# Patient Record
Sex: Male | Born: 1970 | Race: White | Hispanic: No | Marital: Married | Smoking: Never smoker
Health system: Southern US, Community
[De-identification: ages and names within clinical notes are randomized; demographics above are authoritative.]

## PROBLEM LIST (undated history)

## (undated) DIAGNOSIS — E119 Type 2 diabetes mellitus without complications: Secondary | ICD-10-CM

---

## 2007-07-08 ENCOUNTER — Emergency Department (HOSPITAL_COMMUNITY): Admission: EM | Admit: 2007-07-08 | Discharge: 2007-07-08 | Payer: Self-pay | Admitting: Emergency Medicine

## 2007-07-13 ENCOUNTER — Emergency Department (HOSPITAL_COMMUNITY): Admission: EM | Admit: 2007-07-13 | Discharge: 2007-07-13 | Payer: Self-pay | Admitting: Emergency Medicine

## 2008-07-17 ENCOUNTER — Emergency Department (HOSPITAL_COMMUNITY): Admission: EM | Admit: 2008-07-17 | Discharge: 2008-07-17 | Payer: Self-pay | Admitting: Emergency Medicine

## 2011-01-22 LAB — INFLUENZA A AND B ANTIGEN (CONVERTED LAB): Influenza B Ag: NEGATIVE

## 2014-07-14 ENCOUNTER — Encounter (HOSPITAL_COMMUNITY): Payer: Self-pay | Admitting: Family Medicine

## 2014-07-14 ENCOUNTER — Emergency Department (HOSPITAL_COMMUNITY)
Admission: EM | Admit: 2014-07-14 | Discharge: 2014-07-14 | Disposition: A | Discharge status: Home / Self Care | Payer: 59 | Attending: Emergency Medicine | Admitting: Emergency Medicine

## 2014-07-14 DIAGNOSIS — W5512XA Struck by horse, initial encounter: Secondary | ICD-10-CM | POA: Diagnosis not present

## 2014-07-14 DIAGNOSIS — M79669 Pain in unspecified lower leg: Secondary | ICD-10-CM

## 2014-07-14 DIAGNOSIS — Y998 Other external cause status: Secondary | ICD-10-CM | POA: Insufficient documentation

## 2014-07-14 DIAGNOSIS — Y9289 Other specified places as the place of occurrence of the external cause: Secondary | ICD-10-CM | POA: Insufficient documentation

## 2014-07-14 DIAGNOSIS — Y9389 Activity, other specified: Secondary | ICD-10-CM | POA: Diagnosis not present

## 2014-07-14 DIAGNOSIS — S81801A Unspecified open wound, right lower leg, initial encounter: Secondary | ICD-10-CM | POA: Diagnosis not present

## 2014-07-14 DIAGNOSIS — T148XXA Other injury of unspecified body region, initial encounter: Secondary | ICD-10-CM

## 2014-07-14 DIAGNOSIS — S8991XA Unspecified injury of right lower leg, initial encounter: Secondary | ICD-10-CM | POA: Diagnosis present

## 2014-07-14 MED ORDER — HYDROCODONE-ACETAMINOPHEN 5-325 MG PO TABS: 1.0000 | ORAL_TABLET | ORAL | Status: DC | PRN

## 2014-07-14 MED ORDER — METHOCARBAMOL 500 MG PO TABS: 500.0000 mg | ORAL_TABLET | Freq: Three times a day (TID) | ORAL | Status: DC | PRN

## 2014-07-14 MED ORDER — IBUPROFEN 800 MG PO TABS: 800.0000 mg | ORAL_TABLET | Freq: Three times a day (TID) | ORAL | Status: DC | PRN

## 2014-07-14 NOTE — Discharge Instructions (Signed)
Read the information below.  Use the prescribed medication as directed.  Please discuss all new medications with your pharmacist.  Do not take additional tylenol while taking the prescribed pain medication to avoid overdose.  You may return to the Emergency Department at any time for worsening condition or any new symptoms that concern you.    If you develop uncontrolled pain, weakness or numbness of the extremity, severe discoloration of the skin return to the ER for a recheck.     Please keep weight off of your leg until you are seen by the orthopedist.    Muscle Strain A muscle strain is an injury that occurs when a muscle is stretched beyond its normal length. Usually a small number of muscle fibers are torn when this happens. Muscle strain is rated in degrees. First-degree strains have the least amount of muscle fiber tearing and pain. Second-degree and third-degree strains have increasingly more tearing and pain.  Usually, recovery from muscle strain takes 1-2 weeks. Complete healing takes 5-6 weeks.  CAUSES  Muscle strain happens when a sudden, violent force placed on a muscle stretches it too far. This may occur with lifting, sports, or a fall.  RISK FACTORS Muscle strain is especially common in athletes.  SIGNS AND SYMPTOMS At the site of the muscle strain, there may be:  Pain.  Bruising.  Swelling.  Difficulty using the muscle due to pain or lack of normal function. DIAGNOSIS  Your health care provider will perform a physical exam and ask about your medical history. TREATMENT  Often, the best treatment for a muscle strain is resting, icing, and applying cold compresses to the injured area.  HOME CARE INSTRUCTIONS   Use the PRICE method of treatment to promote muscle healing during the first 2-3 days after your injury. The PRICE method involves:  Protecting the muscle from being injured again.  Restricting your activity and resting the injured body part.  Icing your  injury. To do this, put ice in a plastic bag. Place a towel between your skin and the bag. Then, apply the ice and leave it on from 15-20 minutes each hour. After the third day, switch to moist heat packs.  Apply compression to the injured area with a splint or elastic bandage. Be careful not to wrap it too tightly. This may interfere with blood circulation or increase swelling.  Elevate the injured body part above the level of your heart as often as you can.  Only take over-the-counter or prescription medicines for pain, discomfort, or fever as directed by your health care provider.  Warming up prior to exercise helps to prevent future muscle strains. SEEK MEDICAL CARE IF:   You have increasing pain or swelling in the injured area.  You have numbness, tingling, or a significant loss of strength in the injured area. MAKE SURE YOU:   Understand these instructions.  Will watch your condition.  Will get help right away if you are not doing well or get worse. Document Released: 04/16/2005 Document Revised: 02/04/2013 Document Reviewed: 11/13/2012 [facility] Patient Information 2015 [place]. This information is not intended to replace advice given to you by your health care provider. Make sure you discuss any questions you have with your health care provider.  Musculoskeletal Pain Musculoskeletal pain is muscle and boney aches and pains. These pains can occur in any part of the body. Your caregiver may treat you without knowing the cause of the pain. They may treat you if blood or urine tests,  X-rays, and other tests were normal.  CAUSES There is often not a definite cause or reason for these pains. These pains may be caused by a type of germ (virus). The discomfort may also come from overuse. Overuse includes working out too hard when your body is not fit. Boney aches also come from weather changes. Bone is sensitive to atmospheric pressure changes. HOME CARE INSTRUCTIONS   Ask when  your test results will be ready. Make sure you get your test results.  Only take over-the-counter or prescription medicines for pain, discomfort, or fever as directed by your caregiver. If you were given medications for your condition, do not drive, operate machinery or power tools, or sign legal documents for 24 hours. Do not drink alcohol. Do not take sleeping pills or other medications that may interfere with treatment.  Continue all activities unless the activities cause more pain. When the pain lessens, slowly resume normal activities. Gradually increase the intensity and duration of the activities or exercise.  During periods of severe pain, bed rest may be helpful. Lay or sit in any position that is comfortable.  Putting ice on the injured area.  Put ice in a bag.  Place a towel between your skin and the bag.  Leave the ice on for 15 to 20 minutes, 3 to 4 times a day.  Follow up with your caregiver for continued problems and no reason can be found for the pain. If the pain becomes worse or does not go away, it may be necessary to repeat tests or do additional testing. Your caregiver may need to look further for a possible cause. SEEK IMMEDIATE MEDICAL CARE IF:  You have pain that is getting worse and is not relieved by medications.  You develop chest pain that is associated with shortness or breath, sweating, feeling sick to your stomach (nauseous), or throw up (vomit).  Your pain becomes localized to the abdomen.  You develop any new symptoms that seem different or that concern you. MAKE SURE YOU:   Understand these instructions.  Will watch your condition.  Will get help right away if you are not doing well or get worse. Document Released: 04/16/2005 Document Revised: 07/09/2011 Document Reviewed: 12/19/2012 [facility] Patient Information 2015 [place], [place]. This information is not intended to replace advice given to you by your health care provider. Make sure you discuss  any questions you have with your health care provider.

## 2014-07-14 NOTE — ED Provider Notes (Signed)
CSN: [id number]     Arrival date & time 07/14/14  0715 History   First MD Initiated Contact with Patient 07/14/14 [phone]     Chief Complaint  Patient presents with  . Leg Pain     (Consider location/radiation/quality/duration/timing/severity/associated sxs/prior Treatment) HPI   Pt presents with pain and swelling in his right calf.  States he was walking in the woods 4 days ago and stepped with his heel dipping low into a hole and developed pain in his right calf.  Yesterday he was training a horse, the horse jumped and he had to quickly move out of the way and he developed sudden worsening of the same area.  Feels like a constant cramp, worse with walking and palpation, moderate pain.  Has used aspercream and  Ibuprofen without relief.  Denies fevers, knee or ankle pain, weakness or numbness of the foot.  Denies other injuries or pain.   History reviewed. No pertinent past medical history. History reviewed. No pertinent past surgical history. History reviewed. No pertinent family history. History  Substance Use Topics  . Smoking status: Never Smoker   . Smokeless tobacco: Not on file  . Alcohol Use: No    Review of Systems  Constitutional: Negative for fever.  Cardiovascular: Positive for leg swelling.  Musculoskeletal: Positive for myalgias and gait problem. Negative for back pain, joint swelling and arthralgias.  Skin: Negative for color change, pallor, rash and wound.  Allergic/Immunologic: Negative for immunocompromised state.  Neurological: Negative for weakness and numbness.  Hematological: Does not bruise/bleed easily.  Psychiatric/Behavioral: Negative for self-injury (accidental).      Allergies  Review of patient's allergies indicates no known allergies.  Home Medications   Prior to Admission medications   Not on File   BP 126/77 mmHg  Pulse 78  Temp(Src) 98.2 F (36.8 C)  Resp 18  SpO2 100% Physical Exam  Constitutional: He appears well-developed and  well-nourished. No distress.  HENT:  Head: Normocephalic and atraumatic.  Neck: Neck supple.  Pulmonary/Chest: Effort normal.  Musculoskeletal:       Right knee: He exhibits normal range of motion and no swelling. No tenderness found.       Right ankle: He exhibits normal range of motion. No tenderness.       Right upper leg: He exhibits no tenderness and no swelling.       Right lower leg: He exhibits tenderness and swelling. He exhibits no bony tenderness, no deformity and no laceration.       Legs:      Right foot: Normal.  Tenderness and swelling of medal aspect of gastrocnemius.  No masses.   Thompson test is painful but achilles is intact.   Distal pulses and sensation intact.   Pt ambulates with limp.    Neurological: He is alert.  Skin: He is not diaphoretic.  Nursing note and vitals reviewed.   ED Course  Procedures (including critical care time) Labs Review Labs Reviewed - No data to display  Imaging Review No results found.   EKG Interpretation None       Discussed pt diagnosis and treatment with Dr Blinda Leatherwood.    MDM   Final diagnoses:  Calf pain, unspecified laterality  Muscle tear    Afebrile, nontoxic patient with injury to his right calf, first while walking in the woods and over-stretching the calf and second with sudden movement trying to avoid an untrained horse. No bony tenderness.  Neurovascularly intact.  No masses to suggest full tear  of the muscle.  Suspect partial tear of gastrocnemius caput mediale.    D/C home with pain medication, ace bandage, crutches, orthopedic follow up.  Discussed result, findings, treatment, and follow up  with patient.  Pt given return precautions.  Pt verbalizes understanding and agrees with plan.        Trixie Dredgemily Nevaeh Casillas, PA-C 07/14/14 0830  Gilda Creasehristopher J Pollina, MD 07/14/14 1003

## 2014-07-14 NOTE — ED Notes (Signed)
Pt having leg pain. sts that he pulled the back of his leg yesterday when tending to his horses. Pt rates pain 7/10.

## 2015-01-26 ENCOUNTER — Emergency Department (HOSPITAL_COMMUNITY): Payer: 59

## 2015-01-26 ENCOUNTER — Other Ambulatory Visit: Payer: Self-pay | Admitting: Nurse Practitioner

## 2015-01-26 ENCOUNTER — Ambulatory Visit (HOSPITAL_COMMUNITY)
Admission: RE | Admit: 2015-01-26 | Discharge: 2015-01-26 | Disposition: A | Discharge status: Home / Self Care | Payer: 59 | Source: Ambulatory Visit | Attending: Nurse Practitioner | Admitting: Nurse Practitioner

## 2015-01-26 ENCOUNTER — Other Ambulatory Visit (HOSPITAL_COMMUNITY): Payer: Self-pay | Admitting: Nurse Practitioner

## 2015-01-26 ENCOUNTER — Encounter (HOSPITAL_COMMUNITY): Payer: Self-pay

## 2015-01-26 ENCOUNTER — Emergency Department (HOSPITAL_COMMUNITY)
Admission: EM | Admit: 2015-01-26 | Discharge: 2015-01-26 | Disposition: A | Discharge status: Home / Self Care | Payer: 59 | Attending: Emergency Medicine | Admitting: Emergency Medicine

## 2015-01-26 DIAGNOSIS — R109 Unspecified abdominal pain: Secondary | ICD-10-CM

## 2015-01-26 DIAGNOSIS — R1011 Right upper quadrant pain: Secondary | ICD-10-CM

## 2015-01-26 LAB — COMPREHENSIVE METABOLIC PANEL
ALK PHOS: 52 U/L (ref 38–126)
ALT: 25 U/L (ref 17–63)
AST: 27 U/L (ref 15–41)
Albumin: 3.8 g/dL (ref 3.5–5.0)
Anion gap: 8 (ref 5–15)
BILIRUBIN TOTAL: 2 mg/dL — AB (ref 0.3–1.2)
BUN: 14 mg/dL (ref 6–20)
CALCIUM: 9.1 mg/dL (ref 8.9–10.3)
CHLORIDE: 97 mmol/L — AB (ref 101–111)
CO2: 28 mmol/L (ref 22–32)
CREATININE: 1.07 mg/dL (ref 0.61–1.24)
Glucose, Bld: 327 mg/dL — ABNORMAL HIGH (ref 65–99)
Potassium: 4.4 mmol/L (ref 3.5–5.1)
Sodium: 133 mmol/L — ABNORMAL LOW (ref 135–145)
Total Protein: 6.9 g/dL (ref 6.5–8.1)

## 2015-01-26 LAB — CBC WITH DIFFERENTIAL/PLATELET
BASOS ABS: 0.1 10*3/uL (ref 0.0–0.1)
Basophils Relative: 1 %
EOS PCT: 3 %
Eosinophils Absolute: 0.3 10*3/uL (ref 0.0–0.7)
HEMATOCRIT: 48.9 % (ref 39.0–52.0)
HEMOGLOBIN: 17.2 g/dL — AB (ref 13.0–17.0)
LYMPHS ABS: 2.5 10*3/uL (ref 0.7–4.0)
LYMPHS PCT: 31 %
MCH: 31.6 pg (ref 26.0–34.0)
MCHC: 35.2 g/dL (ref 30.0–36.0)
MCV: 89.7 fL (ref 78.0–100.0)
Monocytes Absolute: 0.8 10*3/uL (ref 0.1–1.0)
Monocytes Relative: 10 %
NEUTROS ABS: 4.6 10*3/uL (ref 1.7–7.7)
Neutrophils Relative %: 55 %
Platelets: 236 10*3/uL (ref 150–400)
RBC: 5.45 MIL/uL (ref 4.22–5.81)
RDW: 12.7 % (ref 11.5–15.5)
WBC: 8.2 10*3/uL (ref 4.0–10.5)

## 2015-01-26 LAB — LIPASE, BLOOD: LIPASE: 32 U/L (ref 22–51)

## 2015-01-26 MED ORDER — IOHEXOL 300 MG/ML  SOLN
100.0000 mL | Freq: Once | INTRAMUSCULAR | Status: AC | PRN
  Administered 2015-01-26: 100 mL via INTRAVENOUS

## 2015-01-26 MED ORDER — TRAMADOL HCL 50 MG PO TABS: 50.0000 mg | ORAL_TABLET | Freq: Two times a day (BID) | ORAL | Status: DC | PRN

## 2015-01-26 NOTE — ED Notes (Signed)
Per patient he started having stabbing pains in right upper abdominal area that started on this past Sunday; Pt states pain at 9/10 intermit; Pt a&ox 4 on arrival

## 2015-01-26 NOTE — Discharge Instructions (Signed)
Abdominal Pain Mr. Bochenek, your bloodwork and ultrasound today were normal. Take ibuprofen 800 mg every 8 hours as needed for pain. If your pain becomes severe take 1 tramadol. See a primary care physician within 3 days for close follow-up. If any symptoms worsen come back to the emergency department immediately. Thank you. Many things can cause belly (abdominal) pain. Most times, the belly pain is not dangerous. Many cases of belly pain can be watched and treated at home. HOME CARE   Do not take medicines that help you go poop (laxatives) unless told to by your doctor.  Only take medicine as told by your doctor.  Eat or drink as told by your doctor. Your doctor will tell you if you should be on a special diet. GET HELP IF:  You do not know what is causing your belly pain.  You have belly pain while you are sick to your stomach (nauseous) or have runny poop (diarrhea).  You have pain while you pee or poop.  Your belly pain wakes you up at night.  You have belly pain that gets worse or better when you eat.  You have belly pain that gets worse when you eat fatty foods.  You have a fever. GET HELP RIGHT AWAY IF:   The pain does not go away within 2 hours.  You keep throwing up (vomiting).  The pain changes and is only in the right or left part of the belly.  You have bloody or tarry looking poop. MAKE SURE YOU:   Understand these instructions.  Will watch your condition.  Will get help right away if you are not doing well or get worse. Document Released: 10/03/2007 Document Revised: 04/21/2013 Document Reviewed: 12/24/2012 [facility] Patient Information 2015 [place], [place]. This information is not intended to replace advice given to you by your health care provider. Make sure you discuss any questions you have with your health care provider.

## 2015-01-26 NOTE — ED Provider Notes (Signed)
CSN: [id number]     Arrival date & time 01/26/15  4098 History   By signing my name below, I, Arlan Organ, attest that this documentation has been prepared under the direction and in the presence of Tomasita Crumble, MD. Electronically Signed: Arlan Organ, ED Scribe. 01/26/2015. 3:55 AM.   Chief Complaint  Patient presents with  . Abdominal Pain   The history is provided by the patient. No language interpreter was used.    HPI Comments: Cameron Mccarthy is a 43 y.o. male without any pertinent past medical history who presents to the Emergency Department complaining of constant, ongoing R upper quadrant abdominal pain x 3 days. Pain is described as stabbing and currently rated 9/10. Pain is made worse with deep breathing but not with food. No alleviating factors at this time. However, pt states pain eases off as the day goes on. OTC medications attempted at home without any improvement. No recent fever, chills, nausea, vomiting, chest pain, or shortness of breath. Denies any history of same. No previous history of abdominal surgeries. No known allergies to medications.  No past medical history on file. No past surgical history on file. No family history on file. Social History  Substance Use Topics  . Smoking status: Never Smoker   . Smokeless tobacco: Not on file  . Alcohol Use: No    Review of Systems  Constitutional: Negative for fever and chills.  Respiratory: Negative for cough and shortness of breath.   Cardiovascular: Negative for chest pain.  Gastrointestinal: Positive for abdominal pain. Negative for nausea, vomiting and diarrhea.  Genitourinary: Negative for dysuria.  Musculoskeletal: Negative for back pain.  Skin: Negative for rash.  Neurological: Negative for numbness and headaches.  Psychiatric/Behavioral: Negative for confusion.  All other systems reviewed and are negative.     Allergies  Review of patient's allergies indicates no known allergies.  Home Medications    Prior to Admission medications   Medication Sig Start Date End Date Taking? Authorizing Provider  HYDROcodone-acetaminophen (NORCO/VICODIN) 5-325 MG per tablet Take 1 tablet by mouth every 4 (four) hours as needed for moderate pain or severe pain. 07/14/14   Trixie Dredge, PA-C  ibuprofen (ADVIL,MOTRIN) 800 MG tablet Take 1 tablet (800 mg total) by mouth every 8 (eight) hours as needed for mild pain or moderate pain. 07/14/14   Trixie Dredge, PA-C  methocarbamol (ROBAXIN) 500 MG tablet Take 1 tablet (500 mg total) by mouth every 8 (eight) hours as needed for muscle spasms (and pain). 07/14/14   Trixie Dredge, PA-C   Triage Vitals: BP 133/77 mmHg  Pulse 70  Temp(Src) 98.3 F (36.8 C) (Oral)  Resp 18  Ht  (1.778 m)  Wt 215 lb (97.523 kg)  BMI 30.85 kg/m2  SpO2 94%   Physical Exam  Constitutional: He is oriented to person, place, and time. Vital signs are normal. He appears well-developed and well-nourished.  Non-toxic appearance. He does not appear ill. No distress.  HENT:  Head: Normocephalic and atraumatic.  Nose: Nose normal.  Mouth/Throat: Oropharynx is clear and moist. No oropharyngeal exudate.  Eyes: Conjunctivae and EOM are normal. Pupils are equal, round, and reactive to light. No scleral icterus.  Neck: Normal range of motion. Neck supple. No tracheal deviation, no edema, no erythema and normal range of motion present. No thyroid mass and no thyromegaly present.  Cardiovascular: Normal rate, regular rhythm, S1 normal, S2 normal, normal heart sounds, intact distal pulses and normal pulses.  Exam reveals no gallop and no friction  rub.   No murmur heard. Pulses:      Radial pulses are 2+ on the right side, and 2+ on the left side.       Dorsalis pedis pulses are 2+ on the right side, and 2+ on the left side.  Pulmonary/Chest: Effort normal and breath sounds normal. No respiratory distress. He has no wheezes. He has no rhonchi. He has no rales.  Abdominal: Soft. Normal appearance and  bowel sounds are normal. He exhibits no distension, no ascites and no mass. There is no hepatosplenomegaly. There is tenderness. There is no rebound, no guarding and no CVA tenderness.  Tenderness to palpation over RUQ Positive Murphy's sign  Musculoskeletal: Normal range of motion. He exhibits no edema or tenderness.  Lymphadenopathy:    He has no cervical adenopathy.  Neurological: He is alert and oriented to person, place, and time. He has normal strength. No cranial nerve deficit or sensory deficit.  Skin: Skin is warm, dry and intact. No petechiae and no rash noted. He is not diaphoretic. No erythema. No pallor.  Psychiatric: He has a normal mood and affect. His behavior is normal. Judgment normal.  Nursing note and vitals reviewed.   ED Course  Procedures (including critical care time)  DIAGNOSTIC STUDIES: Oxygen Saturation is 94% on RA, adequate by my interpretation.    COORDINATION OF CARE: 3:40 AM- Will order US abdomen limited RUQ, CXR, CBC, CMP, and lipase. Discussed treatment plan with pt at bedside and pt agreed to plan.     Labs Review Labs Reviewed  CBC WITH DIFFERENTIAL/PLATELET - Abnormal; Notable for the following:    Hemoglobin 17.2 (*)    All other components within normal limits  COMPREHENSIVE METABOLIC PANEL - Abnormal; Notable for the following:    Sodium 133 (*)    Chloride 97 (*)    Glucose, Bld 327 (*)    Total Bilirubin 2.0 (*)    All other components within normal limits  LIPASE, BLOOD    Imaging Review Dg Chest 2 View  01/26/2015   CLINICAL DATA:  Right upper quadrant abdominal pain. Coughing. Evaluate for lower lobe pneumonia.  EXAM: CHEST  2 VIEW  COMPARISON:  None.  FINDINGS: Streaky left basilar density compatible with mild atelectasis. No right-sided pleural or pulmonary abnormality to explain pain.  Normal heart size and mediastinal contours.  IMPRESSION: 1. No right-sided pleural or parenchymal finding to explain right upper quadrant pain. 2.  Mild left basilar atelectasis.   Electronically Signed   By: Marnee Spring M.D.   On: 01/26/2015 05:19   US Abdomen Limited Ruq  01/26/2015   CLINICAL DATA:  Right upper quadrant pain  EXAM: US ABDOMEN LIMITED - RIGHT UPPER QUADRANT  COMPARISON:  None.  FINDINGS: Gallbladder:  No gallstones or wall thickening visualized. No sonographic Murphy sign noted. Dependent echoes are likely artifactual as they are not reproducibly seen on all images.  Common bile duct:  Diameter: 2 mm  Liver:  Echogenic liver with poor acoustic transmission and central sparing. No evidence of mass lesion. Antegrade flow in the imaged portal venous system.  IMPRESSION: No acute finding or cholelithiasis.  Hepatic steatosis.   Electronically Signed   By: Marnee Spring M.D.   On: 01/26/2015 04:50   I have personally reviewed and evaluated these images and lab results as part of my medical decision-making.   EKG Interpretation None      MDM   Final diagnoses:  None   Patient presents emergency department for  right upper quadrant abdominal pain for the past 3 days. His physical exam was concerning for gallbladder pathology. Patient also notes a cough recently, will obtain x-ray to evaluate for right lower lobe pneumonia. He is not requesting anything for pain. Laboratory studies only significant for bilirubin of 2.0. He was given IV fluids. Awaiting ultrasound.  Chest x-ray and right upper quadrant ultrasound are negative.  Patient appears well and in no acute distress, he has been observed overnight. We'll discharge home with tramadol for severe pain.  Follow-up is advised and a referral was given. His vital signs were within his normal limits and he is safe for discharge.   I personally performed the services described in this documentation, which was scribed in my presence. The recorded information has been reviewed and is accurate.   Tomasita Crumble, MD 01/26/15 [phone]

## 2015-01-26 NOTE — ED Notes (Signed)
Patient transported to Ultrasound 

## 2016-01-04 ENCOUNTER — Emergency Department (HOSPITAL_COMMUNITY)
Admission: EM | Admit: 2016-01-04 | Discharge: 2016-01-04 | Disposition: A | Discharge status: Home / Self Care | Payer: 59

## 2016-11-18 ENCOUNTER — Encounter (HOSPITAL_COMMUNITY): Payer: Self-pay | Admitting: *Deleted

## 2016-11-18 ENCOUNTER — Ambulatory Visit (HOSPITAL_COMMUNITY)
Admission: EM | Admit: 2016-11-18 | Discharge: 2016-11-18 | Disposition: A | Discharge status: Home / Self Care | Payer: 59 | Attending: Family Medicine | Admitting: Family Medicine

## 2016-11-18 DIAGNOSIS — S80861A Insect bite (nonvenomous), right lower leg, initial encounter: Secondary | ICD-10-CM

## 2016-11-18 DIAGNOSIS — W57XXXA Bitten or stung by nonvenomous insect and other nonvenomous arthropods, initial encounter: Secondary | ICD-10-CM

## 2016-11-18 DIAGNOSIS — R21 Rash and other nonspecific skin eruption: Secondary | ICD-10-CM | POA: Diagnosis not present

## 2016-11-18 DIAGNOSIS — L03115 Cellulitis of right lower limb: Secondary | ICD-10-CM

## 2016-11-18 MED ORDER — DOXYCYCLINE HYCLATE 100 MG PO CAPS: 100.0000 mg | ORAL_CAPSULE | Freq: Two times a day (BID) | ORAL | 0 refills | Status: DC

## 2016-11-18 MED ORDER — HYDROCODONE-ACETAMINOPHEN 5-325 MG PO TABS: 2.0000 | ORAL_TABLET | ORAL | 0 refills | Status: DC | PRN

## 2016-11-18 NOTE — ED Provider Notes (Signed)
CSN: [id number]     Arrival date & time 11/18/16  1402 History   None    Chief Complaint  Patient presents with  . Tick Removal   (Consider location/radiation/quality/duration/timing/severity/associated sxs/prior Treatment) Patient c/o tick bite right leg and right leg rash and right leg pain.   The history is provided by the patient.  Rash  Location:  Leg Leg rash location:  R leg Quality: redness   Severity:  Moderate Onset quality:  Sudden Duration:  2 days Timing:  Constant Chronicity:  New Relieved by:  Nothing Worsened by:  Nothing Ineffective treatments:  None tried   History reviewed. No pertinent past medical history. History reviewed. No pertinent surgical history. History reviewed. No pertinent family history. Social History  Substance Use Topics  . Smoking status: Light Tobacco Smoker    Types: Cigarettes  . Smokeless tobacco: Not on file  . Alcohol use Yes    Review of Systems  Constitutional: Negative.   HENT: Negative.   Eyes: Negative.   Respiratory: Negative.   Cardiovascular: Negative.   Gastrointestinal: Negative.   Endocrine: Negative.   Genitourinary: Negative.   Musculoskeletal: Negative.   Skin: Positive for rash.  Allergic/Immunologic: Negative.   Neurological: Negative.   Hematological: Negative.   Psychiatric/Behavioral: Negative.     Allergies  Patient has no known allergies.  Home Medications   Prior to Admission medications   Medication Sig Start Date End Date Taking? Authorizing Provider  doxycycline (VIBRAMYCIN) 100 MG capsule Take 1 capsule (100 mg total) by mouth 2 (two) times daily. 11/18/16   Deatra Canterxford, Ulla Mckiernan J, FNP  HYDROcodone-acetaminophen (NORCO/VICODIN) 5-325 MG tablet Take 2 tablets by mouth every 4 (four) hours as needed. 11/18/16   Deatra Canterxford, Zaynah Chawla J, FNP  traMADol (ULTRAM) 50 MG tablet Take 1 tablet (50 mg total) by mouth every 12 (twelve) hours as needed for severe pain. 01/26/15   Tomasita Crumbleni, Adeleke, MD   Meds Ordered  and Administered this Visit  Medications - No data to display  BP 137/87 (BP Location: Right Arm)   Pulse 66   Temp 98.8 F (37.1 C) (Oral)   Resp 18   SpO2 99%  No data found.   Physical Exam  Constitutional: He appears well-developed and well-nourished.  HENT:  Head: Normocephalic and atraumatic.  Eyes: Pupils are equal, round, and reactive to light. Conjunctivae and EOM are normal.  Neck: Normal range of motion. Neck supple.  Cardiovascular: Normal rate, regular rhythm and normal heart sounds.   Pulmonary/Chest: Effort normal and breath sounds normal.  Skin: Rash noted.  Bulls eye right leg approx 6 cm diameter  Nursing note and vitals reviewed.   Urgent Care Course     Procedures (including critical care time)  Labs Review Labs Reviewed - No data to display  Imaging Review No results found.   Visual Acuity Review  Right Eye Distance:   Left Eye Distance:   Bilateral Distance:    Right Eye Near:   Left Eye Near:    Bilateral Near:         MDM   1. Tick bite, initial encounter   2. Cellulitis of leg, right    Doxycycline 100mg  one po bid x 14 days #28 Norco 5/325 one po q 6 hours prn #6      Deatra CanterOxford, Javaris Wigington J, FNP 11/18/16 1619

## 2016-11-18 NOTE — ED Triage Notes (Signed)
Pt  Removed       Multiple      From  Various   Locations     sev  Days  Ago     Has    Some   Swelling   Pain  Itching  From  r     Lower  Leg     Feel    Fatigued   And  Tired       Pt  Has  A  Red  Circle  To  Area  On  r   Lower  Leg

## 2017-08-02 ENCOUNTER — Encounter (HOSPITAL_COMMUNITY): Payer: Self-pay | Admitting: Family Medicine

## 2017-08-02 ENCOUNTER — Ambulatory Visit (HOSPITAL_COMMUNITY)
Admission: EM | Admit: 2017-08-02 | Discharge: 2017-08-02 | Disposition: A | Discharge status: Home / Self Care | Payer: 59 | Attending: Family Medicine | Admitting: Family Medicine

## 2017-08-02 DIAGNOSIS — H6591 Unspecified nonsuppurative otitis media, right ear: Secondary | ICD-10-CM | POA: Diagnosis not present

## 2017-08-02 NOTE — ED Provider Notes (Signed)
[facility]    CSN: [id number] Arrival date & time: 08/02/17  1454     History   Chief Complaint Chief Complaint  Patient presents with  . Ear Fullness    HPI Cameron Mccarthy is a 47 y.o. male.   47 yo male here for right ear "clogged" x 3 days. Went to another urgent care and had fluid pushed into his ear and this hurt and did not help symptoms. He admits to trouble hearing out of this year. He had a sinus infection about 1 month ago.      History reviewed. No pertinent past medical history.  There are no active problems to display for this patient.   History reviewed. No pertinent surgical history.     Home Medications    Prior to Admission medications   Not on File    Family History History reviewed. No pertinent family history.  Social History Social History   Tobacco Use  . Smoking status: Light Tobacco Smoker    Types: Cigarettes  Substance Use Topics  . Alcohol use: Yes  . Drug use: No     Allergies   Patient has no known allergies.   Review of Systems Review of Systems  Constitutional: Negative for activity change and appetite change.  HENT: Positive for ear pain. Negative for ear discharge.   Eyes: Negative for discharge and itching.  Respiratory: Negative for apnea and choking.   Cardiovascular: Negative for chest pain and leg swelling.  Gastrointestinal: Negative for abdominal distention and abdominal pain.  Endocrine: Negative for cold intolerance and heat intolerance.  Genitourinary: Negative for difficulty urinating and dysuria.  Musculoskeletal: Negative for arthralgias and back pain.  Neurological: Negative for dizziness and headaches.  Hematological: Negative for adenopathy. Does not bruise/bleed easily.     Physical Exam Triage Vital Signs ED Triage Vitals  Enc Vitals Group     BP 08/02/17 1537 (!) 125/92     Pulse Rate 08/02/17 1537 80     Resp 08/02/17 1537 18     Temp --      Temp src --      SpO2  08/02/17 1537 100 %     Weight --      Height --      Head Circumference --      Peak Flow --      Pain Score 08/02/17 1535 0     Pain Loc --      Pain Edu? --      Excl. in GC? --    No data found.  Updated Vital Signs BP (!) 125/92   Pulse 80   Resp 18   SpO2 100%   Visual Acuity Right Eye Distance:   Left Eye Distance:   Bilateral Distance:    Right Eye Near:   Left Eye Near:    Bilateral Near:     Physical Exam  Constitutional: He is oriented to person, place, and time. He appears well-developed and well-nourished.  HENT:  Head: Normocephalic and atraumatic.  Right TM with clear fluid  Eyes: Pupils are equal, round, and reactive to light. EOM are normal.  Neck: Normal range of motion.  Cardiovascular: Normal rate and intact distal pulses.  Pulmonary/Chest: Effort normal. No respiratory distress.  Musculoskeletal: Normal range of motion. He exhibits no edema.  Neurological: He is alert and oriented to person, place, and time.  Skin: Skin is warm and dry.     UC Treatments / Results  Labs (all  labs ordered are listed, but only abnormal results are displayed) Labs Reviewed - No data to display  EKG None Radiology No results found.  Procedures Procedures (including critical care time)  Medications Ordered in UC Medications - No data to display   Initial Impression / Assessment and Plan / UC Course  I have reviewed the triage vital signs and the nursing notes.  Pertinent labs & imaging results that were available during my care of the patient were reviewed by me and considered in my medical decision making (see chart for details).     1. Serous otitis media, right- advised benign, self-limited nature of disease. Follow up with PCP prn.  Final Clinical Impressions(s) / UC Diagnoses   Final diagnoses:  None    ED Discharge Orders    None       Controlled Substance Prescriptions [place] Controlled Substance Registry consulted? Not Applicable     Rolm BookbinderMoss, Taison Celani, DO 08/02/17 [id number]

## 2017-08-02 NOTE — ED Triage Notes (Signed)
Pt here for trouble hearing out of right ear x 3 days. Denies pain or drainage.

## 2017-08-05 ENCOUNTER — Ambulatory Visit (INDEPENDENT_AMBULATORY_CARE_PROVIDER_SITE_OTHER): Payer: 59

## 2017-08-05 ENCOUNTER — Ambulatory Visit (INDEPENDENT_AMBULATORY_CARE_PROVIDER_SITE_OTHER): Payer: 59 | Admitting: Orthopaedic Surgery

## 2017-08-05 ENCOUNTER — Encounter (INDEPENDENT_AMBULATORY_CARE_PROVIDER_SITE_OTHER): Payer: Self-pay | Admitting: Orthopedic Surgery

## 2017-08-05 VITALS — Ht 70.0 in | Wt 215.0 lb

## 2017-08-05 DIAGNOSIS — G8929 Other chronic pain: Secondary | ICD-10-CM

## 2017-08-05 DIAGNOSIS — M25512 Pain in left shoulder: Secondary | ICD-10-CM | POA: Diagnosis not present

## 2017-08-05 MED ORDER — METHYLPREDNISOLONE ACETATE 40 MG/ML IJ SUSP
40.0000 mg | INTRAMUSCULAR | Status: AC | PRN
  Administered 2017-08-05: 40 mg via INTRAMUSCULAR

## 2017-08-05 MED ORDER — BUPIVACAINE HCL 0.5 % IJ SOLN
2.0000 mL | INTRAMUSCULAR | Status: AC | PRN
  Administered 2017-08-05: 2 mL

## 2017-08-05 MED ORDER — LIDOCAINE HCL 1 % IJ SOLN
2.0000 mL | INTRAMUSCULAR | Status: AC | PRN
  Administered 2017-08-05: 2 mL

## 2017-08-05 NOTE — Progress Notes (Signed)
Office Visit Note   Patient: Cameron Mccarthy           Date of Birth: 09-05-1970           MRN: [id number] Visit Date: 08/05/2017              Requested by: No referring provider defined for this encounter. PCP: Patient, No Pcp Per   Assessment & Plan: Visit Diagnoses:  1. Chronic left shoulder pain     Plan: Impression is left scapulothoracic bursitis versus trigger point.  Trigger point injection was performed today under sterile conditions patient tolerated well.  Home exercises were demonstrated.  Referral to physical therapy.  Questions encouraged and answered.  Follow-up as needed.  Follow-Up Instructions: Return if symptoms worsen or fail to improve.   Orders:  Orders Placed This Encounter  Procedures  . XR Shoulder Left   No orders of the defined types were placed in this encounter.     Procedures: Trigger Point Inj Date/Time: 08/05/2017 5:13 PM Performed by: Tarry KosXu, Tiffane Sheldon M, MD Authorized by: Tarry KosXu, Letisha Yera M, MD   Consent Given by:  Patient Indications:  Pain Total # of Trigger Points:  1 Location: shoulder   Needle Size:  22 G Approach:  Dorsal Medications #1:  2 mL lidocaine 1 %; 40 mg methylPREDNISolone acetate 40 MG/ML; 2 mL bupivacaine 0.5 % Patient tolerance:  Patient tolerated the procedure well with no immediate complications     Clinical Data: No additional findings.   Subjective: Chief Complaint  Patient presents with  . Left Shoulder - Pain    Patient is a healthy 47 year old gentleman who comes in for chronic left scapulothoracic pain on the medial border of the left scapula.  Denies any injuries.  He does endorse popping and crackling but this is more anterior.  Denies any injuries.  He shows horses for profession.   Review of Systems  Constitutional: Negative.   All other systems reviewed and are negative.    Objective: Vital Signs: Ht 5\' 10"  (1.778 m)   Wt 215 lb (97.5 kg)   BMI 30.85 kg/m   Physical Exam  Constitutional: He  is oriented to person, place, and time. He appears well-developed and well-nourished.  HENT:  Head: Normocephalic and atraumatic.  Eyes: Pupils are equal, round, and reactive to light.  Neck: Neck supple.  Pulmonary/Chest: Effort normal.  Abdominal: Soft.  Musculoskeletal: Normal range of motion.  Neurological: He is alert and oriented to person, place, and time.  Skin: Skin is warm.  Psychiatric: He has a normal mood and affect. His behavior is normal. Judgment and thought content normal.  Nursing note and vitals reviewed.   Ortho Exam Left shoulder exam shows popping in the [place] joint but there are no asymmetry of the joint.  There is no pain or redness or swelling with the [place] joint.  His pain is localized to the medial border of the left scapula and more in the superior half.  There is no crepitus that I can appreciate with scapular motion.  Shoulder function is normal. Specialty Comments:  No specialty comments available.  Imaging: Xr Shoulder Left  Result Date: 08/05/2017 No acute or structural abnormalities    PMFS History: There are no active problems to display for this patient.  No past medical history on file.  No family history on file.  No past surgical history on file. Social History   Occupational History  . Not on file  Tobacco Use  . Smoking status:  Light Tobacco Smoker    Types: Cigarettes  . Smokeless tobacco: Never Used  Substance and Sexual Activity  . Alcohol use: Yes  . Drug use: No  . Sexual activity: Not on file

## 2017-09-13 ENCOUNTER — Ambulatory Visit: Payer: 59 | Admitting: Endocrinology

## 2017-10-29 ENCOUNTER — Encounter (HOSPITAL_COMMUNITY): Payer: Self-pay

## 2017-10-29 ENCOUNTER — Other Ambulatory Visit: Payer: Self-pay

## 2017-10-29 ENCOUNTER — Emergency Department (HOSPITAL_COMMUNITY)
Admission: EM | Admit: 2017-10-29 | Discharge: 2017-10-29 | Disposition: A | Discharge status: Home / Self Care | Payer: 59 | Attending: Emergency Medicine | Admitting: Emergency Medicine

## 2017-10-29 ENCOUNTER — Emergency Department (HOSPITAL_COMMUNITY): Payer: 59

## 2017-10-29 DIAGNOSIS — W228XXA Striking against or struck by other objects, initial encounter: Secondary | ICD-10-CM | POA: Diagnosis not present

## 2017-10-29 DIAGNOSIS — F17210 Nicotine dependence, cigarettes, uncomplicated: Secondary | ICD-10-CM | POA: Insufficient documentation

## 2017-10-29 DIAGNOSIS — S61412A Laceration without foreign body of left hand, initial encounter: Secondary | ICD-10-CM

## 2017-10-29 DIAGNOSIS — Y9389 Activity, other specified: Secondary | ICD-10-CM | POA: Diagnosis not present

## 2017-10-29 DIAGNOSIS — Y929 Unspecified place or not applicable: Secondary | ICD-10-CM | POA: Diagnosis not present

## 2017-10-29 DIAGNOSIS — Y998 Other external cause status: Secondary | ICD-10-CM | POA: Diagnosis not present

## 2017-10-29 DIAGNOSIS — E119 Type 2 diabetes mellitus without complications: Secondary | ICD-10-CM | POA: Diagnosis not present

## 2017-10-29 DIAGNOSIS — S6992XA Unspecified injury of left wrist, hand and finger(s), initial encounter: Secondary | ICD-10-CM | POA: Diagnosis present

## 2017-10-29 HISTORY — DX: Type 2 diabetes mellitus without complications: E11.9

## 2017-10-29 MED ORDER — TETANUS-DIPHTH-ACELL PERTUSSIS 5-2.5-18.5 LF-MCG/0.5 IM SUSP
0.5000 mL | Freq: Once | INTRAMUSCULAR | Status: DC
  Filled 2017-10-29: qty 0.5

## 2017-10-29 MED ORDER — LIDOCAINE-EPINEPHRINE (PF) 2 %-1:200000 IJ SOLN
10.0000 mL | Freq: Once | INTRAMUSCULAR | Status: AC
  Administered 2017-10-29: 10 mL
  Filled 2017-10-29: qty 20

## 2017-10-29 MED ORDER — IBUPROFEN 800 MG PO TABS
800.0000 mg | ORAL_TABLET | Freq: Once | ORAL | Status: AC
  Administered 2017-10-29: 800 mg via ORAL
  Filled 2017-10-29: qty 1

## 2017-10-29 MED ORDER — LIDOCAINE-EPINEPHRINE-TETRACAINE (LET) SOLUTION: 3.0000 mL | Freq: Once | NASAL | Status: DC

## 2017-10-29 NOTE — ED Provider Notes (Signed)
[facility]-EMERGENCY DEPT Provider Note   CSN: [id number] Arrival date & time: 10/29/17  1729     History   Chief Complaint Chief Complaint  Patient presents with  . Laceration    HPI Cameron Mccarthy is a 47 y.o. male presenting for evaluation of hand laceration.  Patient states he was changing a tire when he slipped and cut the lateral aspect of his left hand on the fender.  This happened just prior to arrival.  He denies injury elsewhere.  He has had mild bleeding since.  He denies numbness or tingling.  He reports pain of the hand and wrist, although it no injury there.  He has no other medical problems, does not take medications daily.  He is not on blood thinners.  He is not immunocompromised.  He has not taken anything for pain including Tylenol or ibuprofen.  Palpation makes the pain worse, nothing makes it better.  HPI  Past Medical History:  Diagnosis Date  . Diabetes mellitus without complication (HCC)     There are no active problems to display for this patient.   History reviewed. No pertinent surgical history.      Home Medications    Prior to Admission medications   Not on File    Family History History reviewed. No pertinent family history.  Social History Social History   Tobacco Use  . Smoking status: Light Tobacco Smoker    Types: Cigarettes  . Smokeless tobacco: Never Used  Substance Use Topics  . Alcohol use: Yes  . Drug use: No     Allergies   Patient has no known allergies.   Review of Systems Review of Systems  Skin: Positive for wound.  Neurological: Negative for numbness.  Hematological: Does not bruise/bleed easily.     Physical Exam Updated Vital Signs BP 126/87 (BP Location: Left Arm)   Pulse 74   Temp 98.3 F (36.8 C) (Oral)   Resp 16   Ht 5\' 11"  (1.803 m)   Wt 97.5 kg (215 lb)   SpO2 98%   BMI 29.99 kg/m   Physical Exam  Constitutional: He is oriented to person, place, and time. He  appears well-developed and well-nourished. No distress.  HENT:  Head: Normocephalic and atraumatic.  Eyes: EOM are normal.  Neck: Normal range of motion.  Pulmonary/Chest: Effort normal.  Abdominal: He exhibits no distension.  Musculoskeletal: Normal range of motion. He exhibits tenderness.  Laceration of the lateral aspect of the left dorsal hand, approximately 2 cm long.  Full active range of motion of the elbow, wrist, and fingers without apparent difficulty.  Strength of fingers against resistance intact.  Good cap refill.  Good radial pulses.  Neurological: He is alert and oriented to person, place, and time. No sensory deficit.  Skin: Skin is warm. Capillary refill takes less than 2 seconds. No rash noted.  Psychiatric: He has a normal mood and affect.  Nursing note and vitals reviewed.    ED Treatments / Results  Labs (all labs ordered are listed, but only abnormal results are displayed) Labs Reviewed - No data to display  EKG None  Radiology Dg Wrist Complete Left  Result Date: 10/29/2017 CLINICAL DATA:  Acute LEFT wrist injury and laceration. Initial encounter. EXAM: LEFT WRIST - COMPLETE 3+ VIEW COMPARISON:  None. FINDINGS: Soft tissue injury/laceration along the MEDIAL wrist noted. No acute fracture, subluxation or dislocation identified. No radiopaque bodies noted. IMPRESSION: Soft tissue injury/laceration without acute bony abnormality or radiopaque  foreign body. Electronically Signed   By: Harmon Pier M.D.   On: 10/29/2017 18:58    Procedures .Marland KitchenLaceration Repair Date/Time: 10/29/2017 9:00 PM Performed by: Alveria Apley, PA-C Authorized by: Alveria Apley, PA-C   Consent:    Consent obtained:  Verbal   Consent given by:  Patient   Risks discussed:  Infection, pain, poor cosmetic result and poor wound healing Anesthesia (see MAR for exact dosages):    Anesthesia method:  Local infiltration   Local anesthetic:  Lidocaine 2% WITH epi Laceration details:     Location:  Hand   Hand location:  L hand, dorsum   Length (cm):  2   Depth (mm):  5 Repair type:    Repair type:  Simple Pre-procedure details:    Preparation:  Patient was prepped and draped in usual sterile fashion and imaging obtained to evaluate for foreign bodies Exploration:    Wound extent: no foreign bodies/material noted, no nerve damage noted, no tendon damage noted and no underlying fracture noted   Treatment:    Area cleansed with:  Shur-Clens   Amount of cleaning:  Extensive Skin repair:    Repair method:  Sutures   Suture size:  5-0   Suture material:  Prolene   Suture technique:  Simple interrupted   Number of sutures:  5 Approximation:    Approximation:  Close Post-procedure details:    Dressing:  Open (no dressing)   Patient tolerance of procedure:  Tolerated well, no immediate complications   (including critical care time)  Medications Ordered in ED Medications  Tdap (BOOSTRIX) injection 0.5 mL (0.5 mLs Intramuscular Refused 10/29/17 2009)  ibuprofen (ADVIL,MOTRIN) tablet 800 mg (800 mg Oral Given 10/29/17 1906)  lidocaine-EPINEPHrine (XYLOCAINE W/EPI) 2 %-1:200000 (PF) injection 10 mL (10 mLs Infiltration Given 10/29/17 1906)     Initial Impression / Assessment and Plan / ED Course  I have reviewed the triage vital signs and the nursing notes.  Pertinent labs & imaging results that were available during my care of the patient were reviewed by me and considered in my medical decision making (see chart for details).     Patient presented for evaluation of hand lack.  Physical exam reassuring, neurovascularly intact.  Will obtain x-ray of the wrist as patient is having pain at the MCP and lateral wrist.  X-ray viewed interpreted by me, no fractures or dislocation.  Discussed with patient.  Wound numbed, irrigated, and sutured.  Aftercare instructions given.  Discussed follow-up with hand as needed.  Patient does not know when his last tetanus was, does not want  it today.  At this time, patient appears safe for discharge.  Return precautions given.  Patient states he understands and agrees to plan.   Final Clinical Impressions(s) / ED Diagnoses   Final diagnoses:  Laceration of left hand without foreign body, initial encounter    ED Discharge Orders    None       Alveria Apley, PA-C 10/29/17 2101    Benjiman Core, MD 10/30/17 [phone]

## 2017-10-29 NOTE — ED Triage Notes (Signed)
Pt reports that he was changing a tire today  and wrench slipped and cut his hand on fender and sustained a LAC to side of left hand on top of hand. Pt reports tingling sensation and pain throbbing 8/10.  Escorted with spouse. Bleeding controlled.

## 2017-10-29 NOTE — Discharge Instructions (Addendum)
1. Medications: Tylenol or ibuprofen for pain 2. Treatment: ice for swelling, keep wound clean with warm soap and water and keep bandage dry 3. Follow Up: Please return in 10 days to have your stitches/staples removed or sooner if you have concerns. Return to the emergency department for increased redness, drainage of pus from the wound   WOUND CARE  Keep area clean and dry. Wash wound gently with mild soap and warm water daily. Reapply a new bandage after cleaning wound.   Continue daily cleansing with soap and water until stitches are removed.  Do not apply any ointments or creams to the wound while stitches are in place, as this may cause delayed healing. Return if you experience any of the following signs of infection: Swelling, redness, pus drainage, streaking, fever >101.0 F  Return if you experience excessive bleeding that does not stop after 15-20 minutes of constant, firm pressure.

## 2017-11-10 NOTE — Progress Notes (Signed)
Patient ID: Cameron Mccarthy, male   DOB: 1970-05-27, 47 y.o.   MRN: [id number]          Reason for Appointment: Consultation for Type 2 Diabetes  Referring physician: Melony Overly   History of Present Illness:          Date of diagnosis of type 2 diabetes mellitus:    2016     Background history:    His diabetes was diagnosed incidentally when he was in the emergency room for abdominal pain He had a blood sugar of over 300 at that time and symptoms of feeling sluggish, increased urination He was tried on metformin but because of diarrhea and generally not feeling well he did not continue this for more than 3 months Also he thinks it was not helping his sugar Subsequently he did not follow-up and did not monitor his diabetes at all  Recent history:       Non-insulin hypoglycemic drugs the patient is taking are: None  Current history, management, blood sugar patterns and problems identified:  He was diagnosed to have diabetes incidentally when he was being treated by his ENT surgeon for acute onset of hearing loss and was having symptoms of excessive nocturia in the office was over 400  Although he was due to be seen in consultation in 08/2017 he is now coming for evaluation  He has no glucose meter at home, previously did have one  He does not think he recently has had any increased thirst, urination but is feeling more fatigued in general   His glucose nonfasting in the office today was 309  He has not had any recent weight change  Appears to have poor understanding of diabetes and how this is caused as well as not having any meal plan or paying attention to low-fat diet  He does however try to avoid drinks with sugar especially recently        Side effects from medications have been: Diarrhea and malaise from metformin  Compliance with the medical regimen: Poor:    Glucose monitoring: Not  done  Self-care: The diet that the patient has been following is: tries to  limit drinks with sugar.    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 Always use your most recent med list.          empagliflozin 10 MG Tabs tablet Commonly known as:  JARDIANCE Take 10 mg by mouth daily with breakfast.   gabapentin 100 MG capsule Commonly  known as:  NEURONTIN Take 2 capsules (200 mg total) by mouth at bedtime.   glimepiride 4 MG tablet Commonly known as:  AMARYL Take 1 tablet (4 mg total) by mouth daily before supper.   glucose blood test strip Commonly known as:  ONE TOUCH ULTRA TEST Use as instructed to check blood sugar 2 times daily.   ONE TOUCH ULTRA 2 w/Device Kit 1 each by Does not apply route daily. Use meter to check blood sugar 2 times daily.   onetouch ultrasoft lancets Use as instructed to check blood sugar 2 times daily.       Allergies: No Known Allergies  Past Medical History:  Diagnosis Date  . Diabetes mellitus without complication ([place])     History reviewed. No pertinent surgical history.  Family History  Problem Relation Age of Onset  . Diabetes Neg Hx     Social History:  reports that he has been smoking cigarettes.  He has never used smokeless tobacco. He reports that he drinks alcohol. He  reports that he does not use drugs.   Review of Systems  Constitutional: Negative for weight loss.  HENT:       He has mild hearing loss which was treated with partial improvement with prednisone for 12 days by ENT surgeon in 5/19  Eyes:       Has had mild blurred vision especially in the last couple of months  Respiratory: Negative for shortness of breath.        Has some sleepiness after lunch but not generally  Cardiovascular: Negative for leg swelling.  Gastrointestinal: Negative for abdominal pain.  Endocrine: Positive for abnormal weight gain.  Genitourinary:       Only occasional nocturia  Skin: Negative for rash.  Neurological: Positive for tingling. Negative for weakness and numbness.       He has some tingling, coldness and pains in his feet and lower legs, sometimes difficult to sleep with this  Psychiatric/Behavioral: Positive for insomnia.       Due to leg pain     Lipid history: Previously has had high cholesterol, LDL 133 done in 2016, never on treatment   No results found for: CHOL, HDL, LDLCALC, LDLDIRECT, TRIG, CHOLHDL         Blood pressure readings:  BP Readings from Last 3 Encounters:  11/11/17 120/80  10/29/17 126/87  08/02/17 (!) 125/92    Most recent eye exam was few years ago  Most recent foot exam: 10/2017    LABS:  Office Visit on 11/11/2017  Component Date Value Ref Range Status  . POC Glucose 11/11/2017 309* 70 - 99 mg/dl Final  . Hemoglobin A1C 11/11/2017 10.7* 4.0 - 5.6 % Final    Physical Examination:  BP 120/80 (BP Location: Left Arm, Patient Position: Sitting, Cuff Size: Normal)   Pulse 83   Ht '5\' 11"'$  (1.803 m)   Wt 217 lb (98.4 kg)   SpO2 97%   BMI 30.27 kg/m   GENERAL:         Patient has mild abdominal obesity.    HEENT:         Eye exam shows normal external appearance.  Fundus exam shows no retinopathy.  Oral exam shows normal mucosa .  NECK:   There is no lymphadenopathy  Thyroid is not enlarged and no nodules  felt.  Carotids are normal to palpation and no bruit heard  LUNGS:         Chest is symmetrical. Lungs are  clear to auscultation.Marland Kitchen   HEART:         Heart sounds:  S1 and S2 are normal. No murmur or click heard., no S3 or S4.   ABDOMEN:   There is no distention present. Liver and spleen are not palpable. No other mass or tenderness present.    NEUROLOGICAL:   Ankle jerks are absent bilaterally.    Diabetic Foot Exam - Simple   Simple Foot Form Diabetic Foot exam was performed with the following findings:  Yes   Visual Inspection No deformities, no ulcerations, no other skin breakdown bilaterally:  Yes Sensation Testing See comments:  Yes Pulse Check Posterior Tibialis and Dorsalis pulse intact bilaterally:  Yes Comments Decreased monofilament sensation on all the toes and distal plantar surfaces            Vibration sense is mildly reduced in distal first toes. MUSCULOSKELETAL:  There is no swelling or deformity of the peripheral joints.     EXTREMITIES:     There is no edema.  SKIN:       No rash or lesions of concern.        ASSESSMENT:  Diabetes type 2, uncontrolled since at least 2016  A1c is now 10.7  He is currently untreated and even with blood sugars over 300% has only minimal symptoms of fatigue and mild blurred vision Discussed in detail the etiology of diabetes and natural history Discussed that since he has had diabetes for at least 3 to 4 years he may be partially insulin deficient and likely will need to drug treatment to start with Also does need to start improving his diet with cutting back on high fat foods and portions Needs significant amount of diabetes education also  Complications of diabetes: Peripheral neuropathy, painful.  Unknown status of nephropathy and retinopathy Discussed with him the nature of neuropathy and need for better diabetes control  History of hyperlipidemia   PLAN:    Start home glucose monitoring  Since his insurance may be  covering the One Touch ultra 2 system he will start using this  Discussed that sugar targets both fasting and after meals which he needs to check at least once a day on an average  Encouraged him to cut back on high fat foods  Consultation with dietitian will be arranged  Difficult to currently know what medications his insurance covers but will start him on AMARYL generic 4 mg daily at suppertime to help with short-term control, improve glucose toxicity  Also long-term may benefit from [place], he will start with 10 mg daily Discussed action of SGLT 2 drugs on lowering glucose by decreasing kidney absorption of glucose, benefits of weight loss and lower blood pressure, possible side effects including candidiasis and dosage regimen  If he has inadequate control may consider adding a GLP-1 drug  He will hold off on getting his eye exam until blood sugars are improved and stable  To check urine microalbumin when blood sugars are better control as also lipid profile  Follow-up in 3 weeks  Trial of GABAPENTIN 100-200 mg at bedtime for neuropathic symptoms    Patient Instructions  Check blood sugars on waking up  2-3/7 days   Also check blood sugars about 2 hours after a meal and do this after different meals by rotation  Recommended blood sugar levels on waking up is 90-130 and about 2 hours after meal is 130-160  Please bring your blood sugar monitor to each visit, thank you  Counseling time on subjects discussed in assessment and plan sections is over 50% of today's 60 minute visit   Consultation note has been sent to the referring physician  Elayne Snare 11/11/2017, 3:18 PM   Note: This office note was prepared with Dragon voice recognition system technology. Any transcriptional errors that result from this process are unintentional.

## 2017-11-11 ENCOUNTER — Ambulatory Visit: Payer: 59 | Admitting: Endocrinology

## 2017-11-11 ENCOUNTER — Other Ambulatory Visit: Payer: Self-pay

## 2017-11-11 ENCOUNTER — Encounter: Payer: Self-pay | Admitting: Endocrinology

## 2017-11-11 VITALS — BP 120/80 | HR 83 | Ht 71.0 in | Wt 217.0 lb

## 2017-11-11 DIAGNOSIS — E7800 Pure hypercholesterolemia, unspecified: Secondary | ICD-10-CM

## 2017-11-11 DIAGNOSIS — E1142 Type 2 diabetes mellitus with diabetic polyneuropathy: Secondary | ICD-10-CM

## 2017-11-11 DIAGNOSIS — E1165 Type 2 diabetes mellitus with hyperglycemia: Secondary | ICD-10-CM

## 2017-11-11 LAB — POCT GLYCOSYLATED HEMOGLOBIN (HGB A1C): Hemoglobin A1C: 10.7 % — AB (ref 4.0–5.6)

## 2017-11-11 LAB — COMPREHENSIVE METABOLIC PANEL
ALT: 25 U/L (ref 0–53)
AST: 15 U/L (ref 0–37)
Albumin: 4 g/dL (ref 3.5–5.2)
Alkaline Phosphatase: 50 U/L (ref 39–117)
BUN: 17 mg/dL (ref 6–23)
CO2: 27 meq/L (ref 19–32)
CREATININE: 1.38 mg/dL (ref 0.40–1.50)
Calcium: 9 mg/dL (ref 8.4–10.5)
Chloride: 99 mEq/L (ref 96–112)
GFR: 58.59 mL/min — AB (ref >60.00)
GLUCOSE: 313 mg/dL — AB (ref 70–99)
POTASSIUM: 3.8 meq/L (ref 3.5–5.1)
SODIUM: 134 meq/L — AB (ref 135–145)
Total Bilirubin: 1.3 mg/dL — ABNORMAL HIGH (ref 0.2–1.2)
Total Protein: 6.7 g/dL (ref 6.0–8.3)

## 2017-11-11 LAB — GLUCOSE, POCT (MANUAL RESULT ENTRY): POC Glucose: 309 mg/dl — AB (ref 70–99)

## 2017-11-11 MED ORDER — GABAPENTIN 100 MG PO CAPS: 200.0000 mg | ORAL_CAPSULE | Freq: Every day | ORAL | 3 refills | Status: DC

## 2017-11-11 MED ORDER — ONETOUCH ULTRA 2 W/DEVICE KIT: 1.0000 | PACK | Freq: Every day | 0 refills | Status: DC

## 2017-11-11 MED ORDER — EMPAGLIFLOZIN 10 MG PO TABS: 10.0000 mg | ORAL_TABLET | Freq: Every day | ORAL | 3 refills | Status: DC

## 2017-11-11 MED ORDER — GLUCOSE BLOOD VI STRP: ORAL_STRIP | 3 refills | Status: DC

## 2017-11-11 MED ORDER — ONETOUCH ULTRASOFT LANCETS MISC: 3 refills | Status: DC

## 2017-11-11 MED ORDER — GLIMEPIRIDE 4 MG PO TABS: 4.0000 mg | ORAL_TABLET | Freq: Every day | ORAL | 3 refills | Status: DC

## 2017-11-11 NOTE — Patient Instructions (Signed)
Check blood sugars on waking up 2-3/7 days   Also check blood sugars about 2 hours after a meal and do this after different meals by rotation  Recommended blood sugar levels on waking up is 90-130 and about 2 hours after meal is 130-160  Please bring your blood sugar monitor to each visit, thank you   

## 2017-11-13 ENCOUNTER — Ambulatory Visit: Payer: 59 | Admitting: Endocrinology

## 2017-12-02 ENCOUNTER — Encounter: Payer: Self-pay | Admitting: Endocrinology

## 2017-12-02 ENCOUNTER — Ambulatory Visit: Payer: 59 | Admitting: Endocrinology

## 2017-12-02 VITALS — BP 128/84 | HR 87 | Ht 71.0 in | Wt 217.8 lb

## 2017-12-02 DIAGNOSIS — E1165 Type 2 diabetes mellitus with hyperglycemia: Secondary | ICD-10-CM | POA: Diagnosis not present

## 2017-12-02 MED ORDER — GABAPENTIN 300 MG PO CAPS: 300.0000 mg | ORAL_CAPSULE | Freq: Three times a day (TID) | ORAL | 3 refills | Status: DC

## 2017-12-02 MED ORDER — EMPAGLIFLOZIN 25 MG PO TABS: 25.0000 mg | ORAL_TABLET | Freq: Every day | ORAL | 2 refills | Status: DC

## 2017-12-02 NOTE — Patient Instructions (Addendum)
Check blood sugars on waking up  3/7  Also check blood sugars about 2 hours after a meal and do this after different meals by rotation  Recommended blood sugar levels on waking up is 90-130 and about 2 hours after meal is 130-160  Please bring your blood sugar monitor to each visit, thank you  Take 2 Jardiance in am of 10mg 

## 2017-12-02 NOTE — Progress Notes (Signed)
Patient ID: Cameron Mccarthy, male   DOB: May 24, 1970, 47 y.o.   MRN: [id number]          Reason for Appointment: Follow-up for Type 2 Diabetes  Referring physician: Melony Overly   History of Present Illness:          Date of diagnosis of type 2 diabetes mellitus:   2016     Background history:    His diabetes was diagnosed incidentally when he was in the emergency room for abdominal pain He had a blood sugar of over 300 at that time and symptoms of feeling sluggish, increased urination He was tried on metformin but because of diarrhea and generally not feeling well he did not continue this for more than 3 months Also he thinks it was not helping his sugar Subsequently he did not follow-up and did not monitor his diabetes at all  Recent history:      His baseline A1c was 10.7 done in 10/2017  Non-insulin hypoglycemic drugs the patient is taking are: Amaryl 4 mg at dinnertime, Jardiance 10 mg in the morning  Current history, management, blood sugar patterns and problems identified:  He was found to have marked hyperglycemia when he was being treated by his ENT surgeon with steroids in 5/19  Blood sugar in the office prior to treatment was over 300  With starting Jardiance and Amaryl his blood sugars have progressively come down still not quite normal consistently  He has done readings mostly in the morning and some before lunch and dinner  Fasting readings are still relatively high  He was supposed to see the dietitian but still has not made an appointment  Weight is the same  No side effects with Jardiance        Side effects from medications have been: Diarrhea and malaise from metformin   Glucose monitoring: Not  done  Self-care: The diet that the patient has been following is: tries to limit drinks with sugar.    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 Always use your most recent med list.          empagliflozin 10 MG Tabs tablet Commonly known as:  JARDIANCE Take 10 mg by mouth daily with breakfast.   gabapentin 100 MG capsule Commonly known as:  NEURONTIN Take 2 capsules (200 mg total) by mouth at bedtime.   glimepiride 4 MG tablet Commonly known as:  AMARYL Take 1 tablet (4 mg total) by mouth daily before  supper.   glucose blood test strip Commonly known as:  ONE TOUCH ULTRA TEST Use as instructed to check blood sugar 2 times daily.   ONE TOUCH ULTRA 2 w/Device Kit 1 each by Does not apply route daily. Use meter to check blood sugar 2 times daily.   onetouch ultrasoft lancets Use as instructed to check blood sugar 2 times daily.       Allergies: No Known Allergies  Past Medical History:  Diagnosis Date  . Diabetes mellitus without complication ([place])     No past surgical history on file.  Family History  Problem Relation Age of Onset  . Diabetes Neg Hx     Social History:  reports that he has been smoking cigarettes.  He has never used smokeless tobacco. He reports that he drinks alcohol. He reports that he does not use drugs.   Review of Systems   Lipid history: Previously has had high cholesterol, LDL 133 done in 2016, never on treatment   No results found for:  CHOL, HDL, LDLCALC, LDLDIRECT, TRIG, CHOLHDL         Blood pressure readings:  BP Readings from Last 3 Encounters:  12/02/17 128/84  11/11/17 120/80  10/29/17 126/87    Most recent eye exam was few years ago  Most recent foot exam: 10/2017  Neuropathy: He has had sharp and throbbing pains in his feet and legs and some tingling which has been going on for some time He was given gabapentin in 7/19 and he has had some relief with taking as much as 200 mg at bedtime Symptoms are not consistently controlled  LABS:  No visits with results within 1 Week(s) from this visit.  Latest known visit with results is:  Office Visit on 11/11/2017  Component Date Value Ref Range Status  . POC Glucose 11/11/2017 309* 70 - 99 mg/dl Final  . Hemoglobin A1C 11/11/2017 10.7* 4.0 - 5.6 % Final  . Sodium 11/11/2017 134* 135 - 145 mEq/L Final  . Potassium 11/11/2017 3.8  3.5 - 5.1 mEq/L Final  . Chloride 11/11/2017 99  96 - 112 mEq/L Final  . CO2 11/11/2017 27  19 - 32 mEq/L Final  . Glucose, Bld 11/11/2017 313* 70 - 99 mg/dL Final  . BUN 11/11/2017 17  6 - 23 mg/dL Final  . Creatinine, Ser 11/11/2017 1.38  0.40 - 1.50 mg/dL Final  . Total Bilirubin 11/11/2017 1.3* 0.2 - 1.2 mg/dL Final  . Alkaline Phosphatase 11/11/2017 50  39 - 117 U/L Final  . AST 11/11/2017 15  0 - 37 U/L Final  . ALT 11/11/2017 25  0 - 53 U/L Final  . Total Protein 11/11/2017 6.7  6.0 - 8.3 g/dL Final  . Albumin 11/11/2017 4.0  3.5 - 5.2 g/dL Final  . Calcium 11/11/2017 9.0  8.4 - 10.5 mg/dL Final  . GFR 11/11/2017 58.59* >60.00 mL/min Final    Physical Examination:  BP 128/84 (BP Location: Left Arm, Patient Position: Sitting, Cuff Size: Normal)   Pulse 87   Ht '5\' 11"'$  (1.803 m)   Wt 217 lb 12.8 oz (98.8 kg)   SpO2 97%   BMI 30.38 kg/m       ASSESSMENT:  Diabetes type 2, uncontrolled since at least 2016  A1c is at baseline 10.7 in 7/19  See history of present illness for detailed discussion of current diabetes  management, blood sugar patterns and problems identified.  He has been on Jardiance and 4 mg Amaryl With this his  blood sugars are improving but still not adequately controlled especially fasting readings Not checking readings after evening meal currently He does need more diabetes education including meal planning  NEUROPATHY: Symptoms are still not controlled with low doses of gabapentin at bedtime  History of hyperlipidemia: Needs follow-up   PLAN:    Start checking blood sugars after evening meal more regularly  Consultation with dietitian  For now will increase Jardiance to 25 mg and he can use of current prescription using 2 tablets of 10 mg  We will continue Amaryl 4 mg daily for now but will consider switching to Januvia or Ozempic later once blood sugars are more easily controlled  Check A1c on the next visit  To call if he has any symptoms suggestive of hypoglycemia at any time  Increase GABAPENTIN up to 300 mg for more effective symptom relief     There are no Patient Instructions on file for this visit. Counseling time on subjects discussed in assessment and plan sections is over 50% of today's 60 minute visit   Consultation note has been sent to the referring physician  Elayne Snare 12/02/2017, 2:05 PM   Note: This office note was prepared with Dragon voice recognition system technology. Any transcriptional errors that result from this process are unintentional.

## 2017-12-19 ENCOUNTER — Encounter: Payer: 59 | Admitting: Dietician

## 2018-01-14 ENCOUNTER — Other Ambulatory Visit: Payer: 59

## 2018-01-20 ENCOUNTER — Ambulatory Visit: Payer: 59 | Admitting: Endocrinology

## 2018-01-20 DIAGNOSIS — Z0289 Encounter for other administrative examinations: Secondary | ICD-10-CM

## 2018-03-02 ENCOUNTER — Other Ambulatory Visit: Payer: Self-pay | Admitting: Endocrinology

## 2018-04-16 ENCOUNTER — Other Ambulatory Visit: Payer: Self-pay | Admitting: Endocrinology

## 2018-04-30 ENCOUNTER — Other Ambulatory Visit: Payer: Self-pay | Admitting: Endocrinology

## 2018-05-02 ENCOUNTER — Other Ambulatory Visit: Payer: Self-pay | Admitting: Endocrinology

## 2018-08-27 ENCOUNTER — Other Ambulatory Visit: Payer: Self-pay | Admitting: Endocrinology

## 2019-02-23 ENCOUNTER — Encounter (INDEPENDENT_AMBULATORY_CARE_PROVIDER_SITE_OTHER): Payer: Self-pay

## 2019-09-28 ENCOUNTER — Emergency Department (HOSPITAL_COMMUNITY)
Admission: EM | Admit: 2019-09-28 | Discharge: 2019-09-28 | Disposition: A | Discharge status: Home / Self Care | Payer: Self-pay | Attending: Emergency Medicine | Admitting: Emergency Medicine

## 2019-09-28 ENCOUNTER — Encounter (HOSPITAL_COMMUNITY): Payer: Self-pay | Admitting: *Deleted

## 2019-09-28 ENCOUNTER — Other Ambulatory Visit: Payer: Self-pay

## 2019-09-28 ENCOUNTER — Emergency Department (HOSPITAL_COMMUNITY): Payer: Self-pay

## 2019-09-28 DIAGNOSIS — Y929 Unspecified place or not applicable: Secondary | ICD-10-CM | POA: Insufficient documentation

## 2019-09-28 DIAGNOSIS — X500XXA Overexertion from strenuous movement or load, initial encounter: Secondary | ICD-10-CM | POA: Insufficient documentation

## 2019-09-28 DIAGNOSIS — E119 Type 2 diabetes mellitus without complications: Secondary | ICD-10-CM | POA: Insufficient documentation

## 2019-09-28 DIAGNOSIS — S82851A Displaced trimalleolar fracture of right lower leg, initial encounter for closed fracture: Secondary | ICD-10-CM | POA: Insufficient documentation

## 2019-09-28 DIAGNOSIS — Z720 Tobacco use: Secondary | ICD-10-CM | POA: Insufficient documentation

## 2019-09-28 DIAGNOSIS — Y939 Activity, unspecified: Secondary | ICD-10-CM | POA: Insufficient documentation

## 2019-09-28 DIAGNOSIS — Y999 Unspecified external cause status: Secondary | ICD-10-CM | POA: Insufficient documentation

## 2019-09-28 MED ORDER — FENTANYL CITRATE (PF) 100 MCG/2ML IJ SOLN
75.0000 ug | Freq: Once | INTRAMUSCULAR | Status: AC
  Administered 2019-09-28: 75 ug via INTRAVENOUS
  Filled 2019-09-28: qty 2

## 2019-09-28 MED ORDER — OXYCODONE-ACETAMINOPHEN 5-325 MG PO TABS
2.0000 | ORAL_TABLET | Freq: Once | ORAL | Status: AC
  Administered 2019-09-28: 2 via ORAL
  Filled 2019-09-28: qty 2

## 2019-09-28 MED ORDER — OXYCODONE-ACETAMINOPHEN 5-325 MG PO TABS: 1.0000 | ORAL_TABLET | ORAL | 0 refills | Status: DC | PRN

## 2019-09-28 MED ORDER — SODIUM CHLORIDE 0.9 % IV SOLN
INTRAVENOUS | Status: AC | PRN
  Administered 2019-09-28 (×2): 50 mL/h via INTRAVENOUS

## 2019-09-28 MED ORDER — KETAMINE HCL 50 MG/5ML IJ SOSY
1.0000 mg/kg | PREFILLED_SYRINGE | Freq: Once | INTRAMUSCULAR | Status: AC
  Administered 2019-09-28: 50 mg via INTRAVENOUS
  Filled 2019-09-28: qty 10

## 2019-09-28 MED ORDER — PROPOFOL 10 MG/ML IV BOLUS
INTRAVENOUS | Status: AC | PRN
  Administered 2019-09-28: 75 mg via INTRAVENOUS

## 2019-09-28 MED ORDER — KETAMINE HCL 10 MG/ML IJ SOLN
INTRAMUSCULAR | Status: AC | PRN
  Administered 2019-09-28: 50 mg via INTRAVENOUS

## 2019-09-28 MED ORDER — PROPOFOL 10 MG/ML IV BOLUS
1.0000 mg/kg | Freq: Once | INTRAVENOUS | Status: AC
  Administered 2019-09-28: 75 mg via INTRAVENOUS
  Filled 2019-09-28: qty 20

## 2019-09-28 NOTE — ED Triage Notes (Signed)
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 Pt reports he hear a bone snap

## 2019-09-28 NOTE — ED Provider Notes (Signed)
[facility] EMERGENCY DEPARTMENT Provider Note   CSN: [id number] Arrival date & time: 09/28/19  1603     History Chief Complaint  Patient presents with  . Ankle Pain    Cameron Mccarthy is a 49 y.o. male.  The history is provided by the patient. No language interpreter was used.  Ankle Pain Location:  Ankle Time since incident:  1 hour Ankle location:  R ankle Pain details:    Quality:  Aching   Radiates to:  Does not radiate   Severity:  Severe   Onset quality:  Sudden   Duration:  1 hour   Timing:  Constant   Progression:  Worsening Chronicity:  New Foreign body present:  No foreign bodies Tetanus status:  Unknown Relieved by:  Nothing Worsened by:  Nothing Ineffective treatments:  None tried Associated symptoms: swelling   Risk factors: no concern for non-accidental trauma   Pt reports he stepped backwards and heard a pop and had pain in his right ankle.       Past Medical History:  Diagnosis Date  . Diabetes mellitus without complication [facility])     Patient Active Problem List   Diagnosis Date Noted  . Diabetic peripheral neuropathy associated with type 2 diabetes mellitus ([place]) 11/11/2017  . Hypercholesterolemia 11/11/2017    History reviewed. No pertinent surgical history.     Family History  Problem Relation Age of Onset  . Diabetes Neg Hx     Social History   Tobacco Use  . Smoking status: Light Tobacco Smoker    Types: Cigarettes  . Smokeless tobacco: Never Used  Substance Use Topics  . Alcohol use: Yes  . Drug use: No    Home Medications Prior to Admission medications   Medication Sig Start Date End Date Taking? Authorizing Provider  oxyCODONE-acetaminophen (PERCOCET) 5-325 MG tablet Take 1 tablet by mouth every 4 (four) hours as needed for severe pain. 09/28/19 09/27/20  Fransico Meadow, PA-C  gabapentin (NEURONTIN) 100 MG capsule TAKE 2 CAPSULES (200 MG TOTAL) BY MOUTH AT BEDTIME. Patient not taking: Reported on  09/28/2019 08/27/18 09/28/19  Elayne Snare, MD  glimepiride (AMARYL) 4 MG tablet TAKE 1 TABLET (4 MG TOTAL) BY MOUTH DAILY BEFORE SUPPER *FOLLOW UP FOR REFILLS* Patient not taking: No sig reported 05/02/18 09/28/19  Elayne Snare, MD    Allergies    Patient has no known allergies.  Review of Systems   Review of Systems  All other systems reviewed and are negative.   Physical Exam Updated Vital Signs BP (!) 156/111   Pulse 70   Temp 98.1 F (36.7 C) (Oral)   Resp 11   Ht 5\' 11"  (1.803 m)   Wt 95.3 kg   SpO2 97%   BMI 29.29 kg/m   Physical Exam Vitals and nursing note reviewed.  Constitutional:      Appearance: He is well-developed.  HENT:     Head: Normocephalic and atraumatic.  Eyes:     Conjunctiva/sclera: Conjunctivae normal.  Cardiovascular:     Rate and Rhythm: Normal rate.     Heart sounds: No murmur.  Pulmonary:     Effort: Pulmonary effort is normal. No respiratory distress.  Abdominal:     Tenderness: There is no abdominal tenderness.  Musculoskeletal:        General: Swelling and deformity present.  Skin:    General: Skin is warm and dry.  Neurological:     General: No focal deficit present.  Mental Status: He is alert.  Psychiatric:        Mood and Affect: Mood normal.     ED Results / Procedures / Treatments   Labs (all labs ordered are listed, but only abnormal results are displayed) Labs Reviewed - No data to display  EKG None  Radiology DG Ankle 2 Views Right  Result Date: 09/28/2019 CLINICAL DATA:  Right ankle fracture. Status post reduction. Initial encounter. EXAM: RIGHT ANKLE - 2 VIEW COMPARISON:  Prior today FINDINGS: There has been interval reduction of trimalleolar ankle fracture since previous study. Fracture fragments are in near anatomic alignment, and talus is now centered within the ankle mortise. A cast is been applied. IMPRESSION: Successful reduction of trimalleolar ankle fracture-dislocation, which is now in near anatomic  alignment. Electronically Signed   By: Danae Orleans M.D.   On: 09/28/2019 18:08   DG Ankle Complete Right  Result Date: 09/28/2019 CLINICAL DATA:  49 year old male with trauma to the right ankle. EXAM: RIGHT ANKLE - COMPLETE 3+ VIEW COMPARISON:  None. FINDINGS: There is a comminuted fracture of the distal fibula with posterior and lateral angulation of the distal fracture fragment. There is a displaced fracture of the medial malleolus and possible fracture of the posterior malleolus. There is lateral subluxation of the ankle mortise and posterior positioning of the talar dome in relation to the tibial plafond. Artifact versus possible fractures of the bases of the second and third metatarsals. There is degenerative changes and spurring of the tarsal bones. The soft tissues are unremarkable. IMPRESSION: 1. Bimalleolar or possible trimalleolar fracture with lateral and posterior subluxation of the ankle mortise. 2. Artifact versus possible fractures of the bases of the second and third metatarsals. Electronically Signed   By: Elgie Collard M.D.   On: 09/28/2019 16:44    Procedures Procedures (including critical care time)  Medications Ordered in ED Medications  ketamine 50 mg in normal saline 5 mL (10 mg/mL) syringe (50 mg Intravenous Given 09/28/19 1753)  propofol (DIPRIVAN) 10 mg/mL bolus/IV push 95.3 mg (75 mg Intravenous Given 09/28/19 1751)  fentaNYL (SUBLIMAZE) injection 75 mcg (75 mcg Intravenous Given 09/28/19 1637)  0.9 %  sodium chloride infusion ( Intravenous Stopped 09/28/19 1824)  propofol (DIPRIVAN) 10 mg/mL bolus/IV push (75 mg Intravenous Given 09/28/19 1735)  ketamine (KETALAR) injection (50 mg Intravenous Given 09/28/19 1737)  oxyCODONE-acetaminophen (PERCOCET/ROXICET) 5-325 MG per tablet 2 tablet (2 tablets Oral Given 09/28/19 1842)    ED Course  I have reviewed the triage vital signs and the nursing notes.  Pertinent labs & imaging results that were available during my care of  the patient were reviewed by me and considered in my medical decision making (see chart for details).  Clinical Course as of Sep 28 2003  Mon Sep 28, 2019  1624 DG Ankle Complete Left [LS]    Clinical Course User Index [LS] Osie Cheeks   MDM Rules/Calculators/A&P                      MDM:  Herby Abraham shows displaced trimallelor fracture  . Pt reports pain controlled with fentanyl.   concious sedation and reduction by Dr. Rhunette Croft   Pt tolerated procedure well.  Post reduction xray shows good alignment.  Pt placed in a splint and ice applied.  Pt given crutches.  I spoke with Dr. Otelia Sergeant who advised pt should see Dr. Lajoyce Corners in am.  Final Clinical Impression(s) / ED Diagnoses Final diagnoses:  Closed trimalleolar  fracture of right ankle, initial encounter    Rx / DC Orders ED Discharge Orders         Ordered    oxyCODONE-acetaminophen (PERCOCET) 5-325 MG tablet  Every 4 hours PRN     09/28/19 1814        An After Visit Summary was printed and given to the patient.    Osie Cheeks 09/28/19 2013    Derwood Kaplan, MD 09/28/19 [phone]

## 2019-09-28 NOTE — ED Notes (Signed)
EMS gave 50 mcg Fentanyl  X 3 doses Pain decreased to RT ankle.

## 2019-09-28 NOTE — Progress Notes (Signed)
Orthopedic Tech Progress Note Patient Details:  Cameron Mccarthy 11/18/1970 [id number] MD did a reduction of the ankle Ortho Devices Type of Ortho Device: Stirrup splint, Short leg splint Ortho Device/Splint Location: RLE Ortho Device/Splint Interventions: Application   Post Interventions Patient Tolerated: Well Instructions Provided: Care of device   Donald Pore 09/28/2019, 6:06 PM

## 2019-09-28 NOTE — ED Notes (Signed)
Patient given discharge instructions. Questions were answered. Patient verbalized understanding of discharge instructions and care at home.  

## 2019-09-28 NOTE — Discharge Instructions (Signed)
Call Dr. Audrie Lia office tomorrow to be seen for evaluation

## 2019-09-29 ENCOUNTER — Other Ambulatory Visit (HOSPITAL_COMMUNITY)
Admission: RE | Admit: 2019-09-29 | Discharge: 2019-09-29 | Disposition: A | Discharge status: Home / Self Care | Payer: Self-pay | Source: Ambulatory Visit | Attending: Orthopedic Surgery | Admitting: Orthopedic Surgery

## 2019-09-29 ENCOUNTER — Encounter: Payer: Self-pay | Admitting: Orthopedic Surgery

## 2019-09-29 ENCOUNTER — Encounter (HOSPITAL_COMMUNITY): Payer: Self-pay | Admitting: Orthopedic Surgery

## 2019-09-29 ENCOUNTER — Ambulatory Visit (INDEPENDENT_AMBULATORY_CARE_PROVIDER_SITE_OTHER): Payer: BLUE CROSS/BLUE SHIELD | Admitting: Orthopedic Surgery

## 2019-09-29 ENCOUNTER — Other Ambulatory Visit: Payer: Self-pay

## 2019-09-29 ENCOUNTER — Other Ambulatory Visit: Payer: Self-pay | Admitting: Family

## 2019-09-29 DIAGNOSIS — Z20822 Contact with and (suspected) exposure to covid-19: Secondary | ICD-10-CM | POA: Insufficient documentation

## 2019-09-29 DIAGNOSIS — Z01812 Encounter for preprocedural laboratory examination: Secondary | ICD-10-CM | POA: Insufficient documentation

## 2019-09-29 DIAGNOSIS — S82851A Displaced trimalleolar fracture of right lower leg, initial encounter for closed fracture: Secondary | ICD-10-CM

## 2019-09-29 NOTE — Progress Notes (Addendum)
Mr. Cameron Mccarthy denies chest pain or shortness of breath. Mr. Cameron Mccarthy was tested for Covid today and is in quarantine with his family.  Mr Cameron Mccarthy was diagnosed  with diabetes in  July 2019 durina an ED visit, patient had Glucose in the 300's. Mr Cameron Mccarthy was referred Dr. Dr. Lucianne Muss- he was started on Glimepiride. Patient stopped taking DM medication, because it made him feel so bad , "I have to work, so I stopped the mediations."  Mr Cameron Mccarthy does not check CBG, he reports that he is not on a DM diet.   Mr. Cameron Mccarthy does not have a PCP.

## 2019-09-29 NOTE — Progress Notes (Signed)
Office Visit Note   Patient: Cameron Mccarthy           Date of Birth: 17-Dec-1970           MRN: [id number] Visit Date: 09/29/2019              Requested by: No referring provider defined for this encounter. PCP: Patient, No Pcp Per  Chief Complaint  Patient presents with   Right Ankle - Pain    F/U ER ankle fx dislocation s/p reduction yesterday.       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  He went to the emergency room yesterday underwent closed reduction splinted and is seen today for initial evaluation.  Assessment & Plan: Visit Diagnoses:  1. Trimalleolar fracture of ankle, closed, right, initial encounter     Plan: We will plan for open reduction internal fixation of the trimalleolar ankle fracture.  Will need a lateral plate syndesmotic screws and an antiglide plate for the large medial malleolar fragment.  Risk and benefits were discussed including infection neurovascular injury arthritis pain DVT need for additional surgery.  Patient states he understands wished to proceed at this time  Follow-Up Instructions: Return in about 1 week (around 10/06/2019).   Ortho Exam  Patient is alert, oriented, no adenopathy, well-dressed, normal affect, normal respiratory effort. Examination patient has mild swelling there is no blistering.  He has a good dorsalis pedis pulse.  Prereduction and postreduction radiographs were reviewed patient has a small posterior malleolar lip fracture a large medial malleolar fracture with a large shear component.  A Weber C fibular fracture with disruption of the syndesmosis.  Patient's foot is neurovascular intact.  Imaging: DG Ankle 2 Views Right  Result Date: 09/28/2019 CLINICAL DATA:  Right ankle fracture. Status post reduction. Initial encounter. EXAM: RIGHT ANKLE - 2 VIEW COMPARISON:  Prior today FINDINGS: There has been interval reduction of trimalleolar ankle fracture since  previous study. Fracture fragments are in near anatomic alignment, and talus is now centered within the ankle mortise. A cast is been applied. IMPRESSION: Successful reduction of trimalleolar ankle fracture-dislocation, which is now in near anatomic alignment. Electronically Signed   By: Danae Orleans M.D.   On: 09/28/2019 18:08   DG Ankle Complete Right  Result Date: 09/28/2019 CLINICAL DATA:  49 year old male with trauma to the right ankle. EXAM: RIGHT ANKLE - COMPLETE 3+ VIEW COMPARISON:  None. FINDINGS: There is a comminuted fracture of the distal fibula with posterior and lateral angulation of the distal fracture fragment. There is a displaced fracture of the medial malleolus and possible fracture of the posterior malleolus. There is lateral subluxation of the ankle mortise and posterior positioning of the talar dome in relation to the tibial plafond. Artifact versus possible fractures of the bases of the second and third metatarsals. There is degenerative changes and spurring of the tarsal bones. The soft tissues are unremarkable. IMPRESSION: 1. Bimalleolar or possible trimalleolar fracture with lateral and posterior subluxation of the ankle mortise. 2. Artifact versus possible fractures of the bases of the second and third metatarsals. Electronically Signed   By: Elgie Collard M.D.   On: 09/28/2019 16:44   No images are attached to the encounter.  Labs: Lab Results  Component Value Date   HGBA1C 10.7 (A) 11/11/2017     Lab Results  Component Value Date   ALBUMIN 4.0 11/11/2017   ALBUMIN 3.8 01/26/2015  No results found for: MG No results found for: VD25OH  No results found for: PREALBUMIN CBC EXTENDED Latest Ref Rng & Units 01/26/2015  WBC 4.0 - 10.5 K/uL 8.2  RBC 4.22 - 5.81 MIL/uL 5.45  HGB 13.0 - 17.0 g/dL 17.2(H)  HCT 39.0 - 52.0 % 48.9  PLT 150 - 400 K/uL 236  NEUTROABS 1.7 - 7.7 K/uL 4.6  LYMPHSABS 0.7 - 4.0 K/uL 2.5     There is no height or weight on file to  calculate BMI.  Orders:  No orders of the defined types were placed in this encounter.  No orders of the defined types were placed in this encounter.    Procedures: No procedures performed  Clinical Data: No additional findings.  ROS:  All other systems negative, except as noted in the HPI. Review of Systems  Objective: Vital Signs: There were no vitals taken for this visit.  Specialty Comments:  No specialty comments available.  PMFS History: Patient Active Problem List   Diagnosis Date Noted   Diabetic peripheral neuropathy associated with type 2 diabetes mellitus ([place]) 11/11/2017   Hypercholesterolemia 11/11/2017   Past Medical History:  Diagnosis Date   Diabetes mellitus without complication ([place])     Family History  Problem Relation Age of Onset   Diabetes Neg Hx     History reviewed. No pertinent surgical history. Social History   Occupational History   Not on file  Tobacco Use   Smoking status: Light Tobacco Smoker    Types: Cigarettes   Smokeless tobacco: Never Used  Substance and Sexual Activity   Alcohol use: Yes   Drug use: No   Sexual activity: Yes

## 2019-09-30 ENCOUNTER — Encounter (HOSPITAL_COMMUNITY)
Admission: RE | Disposition: A | Discharge status: Home / Self Care | Payer: Self-pay | Source: Home / Self Care | Attending: Orthopedic Surgery

## 2019-09-30 ENCOUNTER — Ambulatory Visit (HOSPITAL_COMMUNITY): Payer: Self-pay | Admitting: Certified Registered Nurse Anesthetist

## 2019-09-30 ENCOUNTER — Ambulatory Visit (HOSPITAL_COMMUNITY)
Admission: RE | Admit: 2019-09-30 | Discharge: 2019-09-30 | Disposition: A | Discharge status: Home / Self Care | Payer: Self-pay | Attending: Orthopedic Surgery | Admitting: Orthopedic Surgery

## 2019-09-30 ENCOUNTER — Encounter (HOSPITAL_COMMUNITY): Payer: Self-pay | Admitting: Orthopedic Surgery

## 2019-09-30 DIAGNOSIS — E119 Type 2 diabetes mellitus without complications: Secondary | ICD-10-CM | POA: Insufficient documentation

## 2019-09-30 DIAGNOSIS — E1142 Type 2 diabetes mellitus with diabetic polyneuropathy: Secondary | ICD-10-CM

## 2019-09-30 DIAGNOSIS — W5519XA Other contact with horse, initial encounter: Secondary | ICD-10-CM | POA: Insufficient documentation

## 2019-09-30 DIAGNOSIS — S93431A Sprain of tibiofibular ligament of right ankle, initial encounter: Secondary | ICD-10-CM | POA: Insufficient documentation

## 2019-09-30 DIAGNOSIS — Z87891 Personal history of nicotine dependence: Secondary | ICD-10-CM | POA: Insufficient documentation

## 2019-09-30 DIAGNOSIS — S82851A Displaced trimalleolar fracture of right lower leg, initial encounter for closed fracture: Secondary | ICD-10-CM | POA: Insufficient documentation

## 2019-09-30 HISTORY — PX: ORIF ANKLE FRACTURE: SHX5408

## 2019-09-30 LAB — GLUCOSE, CAPILLARY
Glucose-Capillary: 290 mg/dL — ABNORMAL HIGH (ref 70–99)
Glucose-Capillary: 258 mg/dL — ABNORMAL HIGH (ref 70–99)
Glucose-Capillary: 280 mg/dL — ABNORMAL HIGH (ref 70–99)
Glucose-Capillary: 298 mg/dL — ABNORMAL HIGH (ref 70–99)
Glucose-Capillary: 317 mg/dL — ABNORMAL HIGH (ref 70–99)
Glucose-Capillary: 323 mg/dL — ABNORMAL HIGH (ref 70–99)

## 2019-09-30 LAB — SURGICAL PCR SCREEN
MRSA, PCR: NEGATIVE
Staphylococcus aureus: NEGATIVE

## 2019-09-30 LAB — CBC
HCT: 50.9 % (ref 39.0–52.0)
Hemoglobin: 17.4 g/dL — ABNORMAL HIGH (ref 13.0–17.0)
MCH: 30.6 pg (ref 26.0–34.0)
MCHC: 34.2 g/dL (ref 30.0–36.0)
MCV: 89.6 fL (ref 80.0–100.0)
Platelets: 270 10*3/uL (ref 150–400)
RBC: 5.68 MIL/uL (ref 4.22–5.81)
RDW: 12.1 % (ref 11.5–15.5)
WBC: 7.8 10*3/uL (ref 4.0–10.5)
nRBC: 0 % (ref 0.0–0.2)

## 2019-09-30 LAB — BASIC METABOLIC PANEL
Anion gap: 12 (ref 5–15)
BUN: 11 mg/dL (ref 6–20)
CO2: 22 mmol/L (ref 22–32)
Calcium: 8.9 mg/dL (ref 8.9–10.3)
Chloride: 99 mmol/L (ref 98–111)
Creatinine, Ser: 0.9 mg/dL (ref 0.61–1.24)
GFR calc Af Amer: >60 mL/min (ref >60)
GFR calc non Af Amer: >60 mL/min (ref >60)
Glucose, Bld: 282 mg/dL — ABNORMAL HIGH (ref 70–99)
Potassium: 4.1 mmol/L (ref 3.5–5.1)
Sodium: 133 mmol/L — ABNORMAL LOW (ref 135–145)

## 2019-09-30 LAB — SARS CORONAVIRUS 2 (TAT 6-24 HRS): SARS Coronavirus 2: NEGATIVE

## 2019-09-30 SURGERY — OPEN REDUCTION INTERNAL FIXATION (ORIF) ANKLE FRACTURE
Anesthesia: General | Site: Ankle | Laterality: Right

## 2019-09-30 MED ORDER — PROPOFOL 10 MG/ML IV BOLUS
INTRAVENOUS | Status: AC
  Filled 2019-09-30: qty 20

## 2019-09-30 MED ORDER — ACETAMINOPHEN 160 MG/5ML PO SOLN: 325.0000 mg | Freq: Once | ORAL | Status: DC | PRN

## 2019-09-30 MED ORDER — CHLORHEXIDINE GLUCONATE 0.12 % MT SOLN
15.0000 mL | Freq: Once | OROMUCOSAL | Status: AC
  Administered 2019-09-30: 15 mL via OROMUCOSAL
  Filled 2019-09-30: qty 15

## 2019-09-30 MED ORDER — HYDROMORPHONE HCL 1 MG/ML IJ SOLN: 0.2500 mg | INTRAMUSCULAR | Status: DC | PRN

## 2019-09-30 MED ORDER — MEPERIDINE HCL 25 MG/ML IJ SOLN: 6.2500 mg | INTRAMUSCULAR | Status: DC | PRN

## 2019-09-30 MED ORDER — 0.9 % SODIUM CHLORIDE (POUR BTL) OPTIME
TOPICAL | Status: DC | PRN
  Administered 2019-09-30 (×2): 1000 mL

## 2019-09-30 MED ORDER — CEFAZOLIN SODIUM-DEXTROSE 2-4 GM/100ML-% IV SOLN
2.0000 g | INTRAVENOUS | Status: AC
  Administered 2019-09-30: 2 g via INTRAVENOUS
  Filled 2019-09-30: qty 100

## 2019-09-30 MED ORDER — PROMETHAZINE HCL 25 MG/ML IJ SOLN: 6.2500 mg | INTRAMUSCULAR | Status: DC | PRN

## 2019-09-30 MED ORDER — PROPOFOL 10 MG/ML IV BOLUS
INTRAVENOUS | Status: DC | PRN
  Administered 2019-09-30: 150 mg via INTRAVENOUS

## 2019-09-30 MED ORDER — FENTANYL CITRATE (PF) 250 MCG/5ML IJ SOLN
INTRAMUSCULAR | Status: AC
  Filled 2019-09-30: qty 5

## 2019-09-30 MED ORDER — INSULIN ASPART 100 UNIT/ML ~~LOC~~ SOLN
SUBCUTANEOUS | Status: AC
  Filled 2019-09-30: qty 1

## 2019-09-30 MED ORDER — LIDOCAINE 2% (20 MG/ML) 5 ML SYRINGE
INTRAMUSCULAR | Status: DC | PRN
  Administered 2019-09-30: 60 mg via INTRAVENOUS

## 2019-09-30 MED ORDER — MIDAZOLAM HCL 2 MG/2ML IJ SOLN
INTRAMUSCULAR | Status: AC
  Administered 2019-09-30: 2 mg via INTRAVENOUS
  Filled 2019-09-30: qty 2

## 2019-09-30 MED ORDER — ACETAMINOPHEN 10 MG/ML IV SOLN: 1000.0000 mg | Freq: Once | INTRAVENOUS | Status: DC | PRN

## 2019-09-30 MED ORDER — INSULIN ASPART 100 UNIT/ML ~~LOC~~ SOLN
7.0000 [IU] | Freq: Once | SUBCUTANEOUS | Status: AC
  Administered 2019-09-30: 7 [IU] via SUBCUTANEOUS

## 2019-09-30 MED ORDER — BUPIVACAINE-EPINEPHRINE (PF) 0.5% -1:200000 IJ SOLN
INTRAMUSCULAR | Status: DC | PRN
Start: 2019-09-30 — End: 2019-09-30
  Administered 2019-09-30: 15 mL
  Administered 2019-09-30: 20 mL

## 2019-09-30 MED ORDER — FENTANYL CITRATE (PF) 100 MCG/2ML IJ SOLN: 50.0000 ug | Freq: Once | INTRAMUSCULAR | Status: AC

## 2019-09-30 MED ORDER — LACTATED RINGERS IV SOLN: INTRAVENOUS | Status: DC

## 2019-09-30 MED ORDER — ONDANSETRON HCL 4 MG/2ML IJ SOLN
INTRAMUSCULAR | Status: DC | PRN
  Administered 2019-09-30: 4 mg via INTRAVENOUS

## 2019-09-30 MED ORDER — ORAL CARE MOUTH RINSE: 15.0000 mL | Freq: Once | OROMUCOSAL | Status: AC

## 2019-09-30 MED ORDER — MIDAZOLAM HCL 2 MG/2ML IJ SOLN
INTRAMUSCULAR | Status: AC
  Filled 2019-09-30: qty 2

## 2019-09-30 MED ORDER — MIDAZOLAM HCL 2 MG/2ML IJ SOLN: 2.0000 mg | Freq: Once | INTRAMUSCULAR | Status: AC

## 2019-09-30 MED ORDER — INSULIN ASPART 100 UNIT/ML ~~LOC~~ SOLN
5.0000 [IU] | Freq: Once | SUBCUTANEOUS | Status: AC
  Administered 2019-09-30: 5 [IU] via SUBCUTANEOUS

## 2019-09-30 MED ORDER — ACETAMINOPHEN 325 MG PO TABS: 325.0000 mg | ORAL_TABLET | Freq: Once | ORAL | Status: DC | PRN

## 2019-09-30 MED ORDER — OXYCODONE-ACETAMINOPHEN 5-325 MG PO TABS: 1.0000 | ORAL_TABLET | ORAL | 0 refills | Status: DC | PRN

## 2019-09-30 MED ORDER — FENTANYL CITRATE (PF) 100 MCG/2ML IJ SOLN
INTRAMUSCULAR | Status: AC
  Administered 2019-09-30: 50 ug via INTRAVENOUS
  Filled 2019-09-30: qty 2

## 2019-09-30 MED ORDER — PHENYLEPHRINE 40 MCG/ML (10ML) SYRINGE FOR IV PUSH (FOR BLOOD PRESSURE SUPPORT)
PREFILLED_SYRINGE | INTRAVENOUS | Status: DC | PRN
  Administered 2019-09-30 (×3): 80 ug via INTRAVENOUS

## 2019-09-30 MED ORDER — MIDAZOLAM HCL 2 MG/2ML IJ SOLN: 1.0000 mg | Freq: Once | INTRAMUSCULAR | Status: DC

## 2019-09-30 MED ORDER — DEXAMETHASONE SODIUM PHOSPHATE 10 MG/ML IJ SOLN
INTRAMUSCULAR | Status: DC | PRN
  Administered 2019-09-30: 10 mg via INTRAVENOUS

## 2019-09-30 MED ORDER — ONDANSETRON HCL 4 MG/2ML IJ SOLN
INTRAMUSCULAR | Status: AC
  Filled 2019-09-30: qty 2

## 2019-09-30 SURGICAL SUPPLY — 60 items
BANDAGE ESMARK 6X9 LF (GAUZE/BANDAGES/DRESSINGS) IMPLANT
BIT DRILL 110X2.5XQCK CNCT (BIT) IMPLANT
BIT DRILL 2.5 (BIT) ×6
BIT DRILL 2.7 (BIT) ×1
BIT DRILL 2.7MM (BIT) IMPLANT
BIT DRILL 2.7XCANN QCK CNCT (BIT) IMPLANT
BIT DRILL CANN 2.7 (BIT) ×2
BIT DRILL CANN 2.7MM (BIT) ×1
BIT DRILL STD 2.0MM (DRILL) IMPLANT
BIT DRL 110X2.5XQCK CNCT (BIT) ×2
BIT DRL 2.7XCANN QCK CNCT (BIT) ×1
BNDG COHESIVE 4X5 TAN STRL (GAUZE/BANDAGES/DRESSINGS) ×3 IMPLANT
BNDG ESMARK 6X9 LF (GAUZE/BANDAGES/DRESSINGS)
BNDG GAUZE ELAST 4 BULKY (GAUZE/BANDAGES/DRESSINGS) ×3 IMPLANT
COVER SURGICAL LIGHT HANDLE (MISCELLANEOUS) ×3 IMPLANT
COVER WAND RF STERILE (DRAPES) ×3 IMPLANT
DRAPE OEC MINIVIEW 54X84 (DRAPES) IMPLANT
DRAPE U-SHAPE 47X51 STRL (DRAPES) ×3 IMPLANT
DRILL BIT 2.7MM (BIT) ×3
DRILL STANDARD 2.0MM (DRILL) ×3
DRSG ADAPTIC 3X8 NADH LF (GAUZE/BANDAGES/DRESSINGS) ×3 IMPLANT
DRSG PAD ABDOMINAL 8X10 ST (GAUZE/BANDAGES/DRESSINGS) ×3 IMPLANT
DURAPREP 26ML APPLICATOR (WOUND CARE) ×3 IMPLANT
ELECT REM PT RETURN 9FT ADLT (ELECTROSURGICAL) ×3
ELECTRODE REM PT RTRN 9FT ADLT (ELECTROSURGICAL) ×1 IMPLANT
GAUZE SPONGE 4X4 12PLY STRL (GAUZE/BANDAGES/DRESSINGS) ×3 IMPLANT
GLOVE BIOGEL PI IND STRL 9 (GLOVE) ×1 IMPLANT
GLOVE BIOGEL PI INDICATOR 9 (GLOVE) ×2
GLOVE SURG ORTHO 9.0 STRL STRW (GLOVE) ×3 IMPLANT
GOWN STRL REUS W/ TWL XL LVL3 (GOWN DISPOSABLE) ×3 IMPLANT
GOWN STRL REUS W/TWL XL LVL3 (GOWN DISPOSABLE) ×9
K-WIRE ACE 1.6X6 (WIRE) ×6
KIT BASIN OR (CUSTOM PROCEDURE TRAY) ×3 IMPLANT
KIT TURNOVER KIT B (KITS) ×3 IMPLANT
KWIRE ACE 1.6X6 (WIRE) IMPLANT
MANIFOLD NEPTUNE II (INSTRUMENTS) ×3 IMPLANT
NS IRRIG 1000ML POUR BTL (IV SOLUTION) ×3 IMPLANT
PACK ORTHO EXTREMITY (CUSTOM PROCEDURE TRAY) ×3 IMPLANT
PAD ARMBOARD 7.5X6 YLW CONV (MISCELLANEOUS) ×6 IMPLANT
PLATE FIBULA LOCK 8HOLE RIGHT (Plate) ×2 IMPLANT
SCREW 3.5X12 (Screw) ×9 IMPLANT
SCREW BN 2.7X12X3.5XST NS (Screw) IMPLANT
SCREW CANN 1/3 THRD RVRS (Screw) IMPLANT
SCREW CANN 4.0X60MM (Screw) ×6 IMPLANT
SCREW CORT 2.5X24X3.5XST SM (Screw) IMPLANT
SCREW CORTICAL 3.5X24MM (Screw) ×3 IMPLANT
SCREW CORTICAL NONLOCK 3.5X50 (Screw) ×4 IMPLANT
SCREW LOCK 14X2.7X NS (Screw) IMPLANT
SCREW LOCKING 2.7X12 (Screw) ×6 IMPLANT
SCREW LOCKING 2.7X14 (Screw) ×3 IMPLANT
STAPLER VISISTAT 35W (STAPLE) IMPLANT
SUCTION FRAZIER HANDLE 10FR (MISCELLANEOUS) ×3
SUCTION TUBE FRAZIER 10FR DISP (MISCELLANEOUS) ×1 IMPLANT
SUT ETHILON 2 0 PSLX (SUTURE) ×2 IMPLANT
SUT VIC AB 2-0 CT1 27 (SUTURE) ×3
SUT VIC AB 2-0 CT1 TAPERPNT 27 (SUTURE) ×1 IMPLANT
TOWEL GREEN STERILE (TOWEL DISPOSABLE) ×3 IMPLANT
TOWEL GREEN STERILE FF (TOWEL DISPOSABLE) ×3 IMPLANT
TUBE CONNECTING 12'X1/4 (SUCTIONS) ×1
TUBE CONNECTING 12X1/4 (SUCTIONS) ×2 IMPLANT

## 2019-09-30 NOTE — Anesthesia Procedure Notes (Signed)
Anesthesia Regional Block: Adductor canal block   Pre-Anesthetic Checklist: ,, timeout performed, Correct Patient, Correct Site, Correct Laterality, Correct Procedure, Correct Position, site marked, Risks and benefits discussed,  Surgical consent,  Pre-op evaluation,  At surgeon's request and post-op pain management  Laterality: Right  Prep: chloraprep       Needles:  Injection technique: Single-shot  Needle Type: Echogenic Stimulator Needle     Needle Length: 9cm  Needle Gauge: 21     Additional Needles:   Procedures:,,,, ultrasound used (permanent image in chart),,,,  Narrative:  Start time: 09/30/2019 2:10 PM End time: 09/30/2019 2:15 PM Injection made incrementally with aspirations every 5 mL.  Performed by: Personally  Anesthesiologist: Shelton Silvas, MD  Additional Notes: Patient tolerated the procedure well. Local anesthetic introduced in an incremental fashion under minimal resistance after negative aspirations. No paresthesias were elicited. After completion of the procedure, no acute issues were identified and patient continued to be monitored by RN.

## 2019-09-30 NOTE — Progress Notes (Signed)
Pt. Blood glucose 280 upon arrival to PACU, Dr. Hart Rochester made aware and ordered 5 units of insulin. Insulin given and blood glucose checked 30 min later and was 323. Dr. Bradley Ferris notified and ordered 7 more units. Insulin given and blood glucose was checked 30 min later at 298.  Dr. Bradley Ferris made aware and was ok for patient to go home. Patient came into hospital at 290 and does not take medicine for his diabetes at home. I educated patient on the importance of maintaining a healthy blood sugar.

## 2019-09-30 NOTE — Anesthesia Procedure Notes (Signed)
Procedure Name: LMA Insertion Date/Time: 09/30/2019 2:42 PM Performed by: Waynard Edwards, CRNA Pre-anesthesia Checklist: Patient identified, Emergency Drugs available, Suction available and Patient being monitored Patient Re-evaluated:Patient Re-evaluated prior to induction Oxygen Delivery Method: Circle system utilized Preoxygenation: Pre-oxygenation with 100% oxygen Induction Type: IV induction Ventilation: Mask ventilation without difficulty LMA: LMA inserted LMA Size: 4.0 Number of attempts: 1 Placement Confirmation: positive ETCO2 and breath sounds checked- equal and bilateral Tube secured with: Tape Dental Injury: Teeth and Oropharynx as per pre-operative assessment

## 2019-09-30 NOTE — Progress Notes (Signed)
Orthopedic Tech Progress Note Patient Details:  Cameron Mccarthy June 11, 1970 [id number]  Ortho Devices Type of Ortho Device: CAM walker Ortho Device/Splint Location: RLE   Post Interventions Patient Tolerated: Well Instructions Provided: Adjustment of device, Care of device   Gurpreet Mikhail 09/30/2019, 5:17 PM

## 2019-09-30 NOTE — Transfer of Care (Signed)
Immediate Anesthesia Transfer of Care Note  Patient: Cameron Mccarthy  Procedure(s) Performed: OPEN REDUCTION INTERNAL FIXATION (ORIF) RIGHT TRIMALLEOLAR ANKLE FRACTURE AND SYNDESMOSIS REPAIR (Right Ankle)  Patient Location: PACU  Anesthesia Type:General  Level of Consciousness: awake, alert , oriented and patient cooperative  Airway & Oxygen Therapy: Patient Spontanous Breathing and Patient connected to nasal cannula oxygen  Post-op Assessment: Report given to RN and Post -op Vital signs reviewed and stable  Post vital signs: Reviewed and stable  Last Vitals:  Vitals Value Taken Time  BP 116/72 09/30/19 1617  Temp 36.1 C 09/30/19 1617  Pulse 78 09/30/19 1621  Resp 20 09/30/19 1621  SpO2 94 % 09/30/19 1621  Vitals shown include unvalidated device data.  Last Pain:  Vitals:   09/30/19 1617  TempSrc:   PainSc: Asleep         Complications: No apparent anesthesia complications

## 2019-09-30 NOTE — Progress Notes (Signed)
Inpatient Diabetes Program Recommendations  AACE/ADA: New Consensus Statement on Inpatient Glycemic Control (2015)  Target Ranges:  Prepandial:   less than 140 mg/dL      Peak postprandial:   less than 180 mg/dL (1-2 hours)      Critically ill patients:  140 - 180 mg/dL   Lab Results  Component Value Date   GLUCAP 258 (H) 09/30/2019   HGBA1C 10.7 (A) 11/11/2017    Review of Glycemic Control Results for Cameron Mccarthy, Cameron Mccarthy (MRN [id number]) as of 09/30/2019 15:02  Ref. Range 09/30/2019 11:05 09/30/2019 13:35  Glucose-Capillary Latest Ref Range: 70 - 99 mg/dL 067 (H) 703 (H)   Diabetes history:  DM2  Outpatient Diabetes medications:  None  Current orders for Inpatient glycemic control:  None  Inpatient Diabetes Program Recommendations:     Novolog 0-9 units tid and 0-5 qhs Current A1C  Thank you, [name], RN, BSN Diabetes Coordinator Inpatient Diabetes Program [phone] (team pager from 8a-5p)

## 2019-09-30 NOTE — Op Note (Signed)
09/30/2019  4:32 PM  PATIENT:  Cameron Mccarthy    PRE-OPERATIVE DIAGNOSIS:  Trimalleolar Right Ankle Fracture with syndesmotic disruption  POST-OPERATIVE DIAGNOSIS:  Same  PROCEDURE:  OPEN REDUCTION INTERNAL FIXATION (ORIF) RIGHT TRIMALLEOLAR ANKLE FRACTURE AND SYNDESMOSIS REPAIR C-arm fluoroscopy to verify reduction  SURGEON:  Nadara Mustard, MD  PHYSICIAN ASSISTANT:None ANESTHESIA:   General  PREOPERATIVE INDICATIONS:  Cameron Mccarthy is a  49 y.o. male with a diagnosis of Trimalleolar Right Ankle Fracture who failed conservative measures and elected for surgical management.    The risks benefits and alternatives were discussed with the patient preoperatively including but not limited to the risks of infection, bleeding, nerve injury, cardiopulmonary complications, the need for revision surgery, among others, and the patient was willing to proceed.  OPERATIVE IMPLANTS: 8 hole Zimmer stainless steel plate laterally with a 50 mm syndesmotic screw and 2 partially-threaded screws for the medial malleolus.  @ENCIMAGES @  OPERATIVE FINDINGS: C-arm fluoroscopy verified reduction of the mortise reduction of the posterior malleolus and bring the fibula out to length.  OPERATIVE PROCEDURE: Patient was brought the operating room and underwent a general anesthetic.  After adequate levels anesthesia were obtained patient's right lower extremity was prepped using DuraPrep draped into a sterile field a timeout was called.  A large lateral incision was made this was carried sharply down to the fibula.  Subperiosteal dissection was used to dissect out the proximal and distal aspects of the fibula.  There was significant comminution of the fracture site and the vasculature to the bone fragments was not disrupted in this area there were several fragments that were loose and discarded.  The 8 hole plate was applied laterally this was locked with 4 locking screws distally the fibula was then pulled out to  length and 3 proximal compression screws were placed this bridged the fracture site and see arthroscopy verified the fibula was brought out to length.  The fragments were then placed back into place to stabilize the length.  A interfrag screw was also placed distally destabilized a split fracture through the distal fragment.  Attention was then focused on the medial malleolus.  A large incision was made medially this was carried down the fracture site the fracture was cleansed this was a shear injury or of a pilon type fracture more so than a medial malleolus fracture.  The fracture was cleansed reduced and stabilized with 260 mm 4.0 partially-threaded screws.  It was elected not to place a antiglide plate due to the fact that patient had ischemic skin changes over the medial fracture site and was concerned of skin breakdown from these medial ischemic areas.  The wounds were irrigated with normal saline the fibula was then aligned and a syndesmotic screw was placed to stabilize the syndesmosis.  See arthroscopy verified congruence of the mortise.  The wounds were irrigated normal saline and the incisions were closed using 2-0 nylon.  The Praveena axial form dressing was applied this had a good suction fit this was overwrapped with Covan patient was extubated taken the PACU in stable condition.   DISCHARGE PLANNING:  Antibiotic duration: Preoperative antibiotics  Weightbearing: Nonweightbearing on the right  Pain medication: Patient has Percocet  Dressing care/ Wound VAC: Continue wound VAC for 1 week  Ambulatory devices: Crutches  Discharge to: Home.  Follow-up: In the office 1 week post operative.

## 2019-09-30 NOTE — Anesthesia Postprocedure Evaluation (Signed)
Anesthesia Post Note  Patient: Cameron Mccarthy  Procedure(s) Performed: OPEN REDUCTION INTERNAL FIXATION (ORIF) RIGHT TRIMALLEOLAR ANKLE FRACTURE AND SYNDESMOSIS REPAIR (Right Ankle)     Patient location during evaluation: PACU Anesthesia Type: General Level of consciousness: awake and alert Pain management: pain level controlled Vital Signs Assessment: post-procedure vital signs reviewed and stable Respiratory status: spontaneous breathing, nonlabored ventilation, respiratory function stable and patient connected to nasal cannula oxygen Cardiovascular status: blood pressure returned to baseline and stable Postop Assessment: no apparent nausea or vomiting Anesthetic complications: no    Last Vitals:  Vitals:   09/30/19 1617 09/30/19 1632  BP: 116/72 126/83  Pulse: 77 75  Resp: 19 18  Temp: (!) 36.1 C   SpO2: 93% 94%    Last Pain:  Vitals:   09/30/19 1632  TempSrc:   PainSc: 0-No pain                 Shelton Silvas

## 2019-09-30 NOTE — Anesthesia Preprocedure Evaluation (Signed)
Anesthesia Evaluation  Patient identified by MRN, date of birth, ID band Patient awake    Reviewed: Allergy & Precautions, NPO status , Patient's Chart, lab work & pertinent test results  Airway Mallampati: II  TM Distance: >3 FB Neck ROM: Full    Dental  (+) Teeth Intact, Dental Advisory Given   Pulmonary former smoker,    Pulmonary exam normal        Cardiovascular negative cardio ROS   Rhythm:Regular Rate:Normal     Neuro/Psych  Neuromuscular disease negative psych ROS   GI/Hepatic negative GI ROS, Neg liver ROS,   Endo/Other  diabetes  Renal/GU   negative genitourinary   Musculoskeletal negative musculoskeletal ROS (+)   Abdominal Normal abdominal exam  (+)   Peds  Hematology negative hematology ROS (+)   Anesthesia Other Findings   Reproductive/Obstetrics                             Anesthesia Physical Anesthesia Plan  ASA: II  Anesthesia Plan: General   Post-op Pain Management:    Induction: Intravenous  PONV Risk Score and Plan: 3 and Ondansetron, Dexamethasone and Midazolam  Airway Management Planned: LMA  Additional Equipment: None  Intra-op Plan:   Post-operative Plan: Extubation in OR  Informed Consent: I have reviewed the patients History and Physical, chart, labs and discussed the procedure including the risks, benefits and alternatives for the proposed anesthesia with the patient or authorized representative who has indicated his/her understanding and acceptance.     Dental advisory given  Plan Discussed with: CRNA  Anesthesia Plan Comments:         Anesthesia Quick Evaluation

## 2019-09-30 NOTE — Anesthesia Procedure Notes (Signed)
Anesthesia Regional Block: Popliteal block   Pre-Anesthetic Checklist: ,, timeout performed, Correct Patient, Correct Site, Correct Laterality, Correct Procedure, Correct Position, site marked, Risks and benefits discussed,  Surgical consent,  Pre-op evaluation,  At surgeon's request and post-op pain management  Laterality: Right  Prep: chloraprep       Needles:  Injection technique: Single-shot  Needle Type: Echogenic Stimulator Needle     Needle Length: 9cm  Needle Gauge: 21     Additional Needles:   Procedures:,,,, ultrasound used (permanent image in chart),,,,  Narrative:  Start time: 09/30/2019 2:05 PM End time: 09/30/2019 2:10 PM Injection made incrementally with aspirations every 5 mL.  Performed by: Personally  Anesthesiologist: Shelton Silvas, MD  Additional Notes: Patient tolerated the procedure well. Local anesthetic introduced in an incremental fashion under minimal resistance after negative aspirations. No paresthesias were elicited. After completion of the procedure, no acute issues were identified and patient continued to be monitored by RN.

## 2019-09-30 NOTE — H&P (Signed)
Cameron Mccarthy is an 49 y.o. male.   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  He went to the emergency room yesterday underwent closed reduction splinted and is seen today for initial evaluation.  Past Medical History:  Diagnosis Date  . Diabetes mellitus without complication ([place])     Not taking DM meds- "I could not work and take DM meds."    History reviewed. No pertinent surgical history.  Family History  Problem Relation Age of Onset  . Diabetes Neg Hx    Social History:  reports that he has quit smoking. His smoking use included cigarettes. He has never used smokeless tobacco. He reports current alcohol use. He reports that he does not use drugs.  Allergies: No Known Allergies  No medications prior to admission.    Results for orders placed or performed during the hospital encounter of 09/29/19 (from the past 48 hour(s))  SARS CORONAVIRUS 2 (TAT 6-24 HRS) Nasopharyngeal Nasopharyngeal Swab     Status: None   Collection Time: 09/29/19 12:30 PM   Specimen: Nasopharyngeal Swab  Result Value Ref Range   SARS Coronavirus 2 NEGATIVE NEGATIVE    Comment: (NOTE) SARS-CoV-2 target nucleic acids are NOT DETECTED. The SARS-CoV-2 RNA is generally detectable in upper and lower respiratory specimens during the acute phase of infection. Negative results do not preclude SARS-CoV-2 infection, do not rule out co-infections with other pathogens, and should not be used as the sole basis for treatment or other patient management decisions. Negative results must be combined with clinical observations, patient history, and epidemiological information. The expected result is Negative. Fact Sheet for Patients: SugarRoll.be Fact Sheet for Healthcare Providers: https://www.woods-mathews.com/ This test is not yet approved or  cleared by the [place] FDA and  has been authorized for detection and/or diagnosis of SARS-CoV-2 by FDA under an Emergency Use Authorization (EUA). This EUA will remain  in effect (meaning this test can be used) for the duration of the COVID-19 declaration under Section 56 4(b)(1) of the Act, 21 U.S.C. section 360bbb-3(b)(1), unless the authorization is terminated or revoked sooner. Performed at [facility], [place] [address] [place], [place] [address]    DG Ankle 2 Views Right  Result Date: 09/28/2019 CLINICAL DATA:  Right ankle fracture. Status post reduction. Initial encounter. EXAM: RIGHT ANKLE - 2 VIEW COMPARISON:  Prior today FINDINGS: There has been interval reduction of trimalleolar ankle fracture since previous study. Fracture fragments are in near anatomic alignment, and talus is now centered within the ankle mortise. A cast is been applied. IMPRESSION: Successful reduction of trimalleolar ankle fracture-dislocation, which is now in near anatomic alignment. Electronically Signed   By: Marlaine Hind M.D.   On: 09/28/2019 18:08   DG Ankle Complete Right  Result Date: 09/28/2019 CLINICAL DATA:  49 year old male with trauma to the right ankle. EXAM: RIGHT ANKLE - COMPLETE 3+ VIEW COMPARISON:  None. FINDINGS: There is a comminuted fracture of the distal fibula with posterior and lateral angulation of the distal fracture fragment. There is a displaced fracture of the medial malleolus and possible fracture of the posterior malleolus. There is lateral subluxation of the ankle mortise and posterior positioning of the talar dome in relation to the tibial plafond. Artifact versus possible fractures of the bases of the second and third metatarsals. There is degenerative changes and spurring of the tarsal bones. The soft tissues  are unremarkable. IMPRESSION: 1. Bimalleolar or possible trimalleolar fracture with lateral and posterior subluxation of the ankle mortise. 2. Artifact versus  possible fractures of the bases of the second and third metatarsals. Electronically Signed   By: Elgie Collard M.D.   On: 09/28/2019 16:44    Review of Systems  All other systems reviewed and are negative.   There were no vitals taken for this visit. Physical Exam  Patient is alert, oriented, no adenopathy, well-dressed, normal affect, normal respiratory effort. Examination patient has mild swelling there is no blistering.  He has a good dorsalis pedis pulse.  Prereduction and postreduction radiographs were reviewed patient has a small posterior malleolar lip fracture a large medial malleolar fracture with a large shear component.  A Weber C fibular fracture with disruption of the syndesmosis.  Patient's foot is neurovascular intact. Assessment/Plan 1. Trimalleolar fracture of ankle, closed, right, initial encounter     Plan: We will plan for open reduction internal fixation of the trimalleolar ankle fracture.  Will need a lateral plate syndesmotic screws and an antiglide plate for the large medial malleolar fragment.  Risk and benefits were discussed including infection neurovascular injury arthritis pain DVT need for additional surgery.  Patient states he understands wished to proceed at this time   Nadara Mustard, MD 09/30/2019, 8:44 AM

## 2019-10-01 ENCOUNTER — Encounter: Payer: Self-pay | Admitting: *Deleted

## 2019-10-06 ENCOUNTER — Encounter: Payer: Self-pay | Admitting: Orthopedic Surgery

## 2019-10-06 ENCOUNTER — Ambulatory Visit (INDEPENDENT_AMBULATORY_CARE_PROVIDER_SITE_OTHER): Payer: Self-pay | Admitting: Physician Assistant

## 2019-10-06 ENCOUNTER — Other Ambulatory Visit: Payer: Self-pay

## 2019-10-06 VITALS — Ht 71.0 in | Wt 210.0 lb

## 2019-10-06 DIAGNOSIS — S82851A Displaced trimalleolar fracture of right lower leg, initial encounter for closed fracture: Secondary | ICD-10-CM

## 2019-10-06 MED ORDER — OXYCODONE-ACETAMINOPHEN 5-325 MG PO TABS: 1.0000 | ORAL_TABLET | ORAL | 0 refills | Status: DC | PRN

## 2019-10-06 NOTE — Progress Notes (Signed)
Office Visit Note   Patient: Cameron Mccarthy           Date of Birth: [birth year]           MRN: [id number] Visit Date: 10/06/2019              Requested by: No referring provider defined for this encounter. PCP: Patient, No Pcp Per  Chief Complaint  Patient presents with  . Right Ankle - Routine Post Op    09/30/2019 ORIF right ankle fx      HPI: This is a pleasant 49 year old gentleman who is 1 week status post open reduction internal fixation of right trimalleolar ankle fracture.  He has been nonweightbearing with a wound VAC.  He denies fever, chills, or calf pain  Assessment & Plan: Visit Diagnoses: No diagnosis found.  Plan: Patient may do daily dressing changes.  I demonstrated this to his wife today daily cleansing with Dial soap and water.  I will refill his pain medication.  Follow-up 1 week at that time ankle x-ray should be obtained  Follow-Up Instructions: No follow-ups on file.   Ortho Exam  Patient is alert, oriented, no adenopathy, well-dressed, normal affect, normal respiratory effort. Focused examination of his right ankle demonstrates healing surgical incision.  He has well apposed wound edges with some bloody drainage.  No necrosis no surrounding cellulitis.  Compartments are soft and nontender.  Swelling is well controlled.  Skin is in good condition  Imaging: No results found. No images are attached to the encounter.  Labs: Lab Results  Component Value Date   HGBA1C 10.7 (A) 11/11/2017     Lab Results  Component Value Date   ALBUMIN 4.0 11/11/2017   ALBUMIN 3.8 01/26/2015    No results found for: MG No results found for: VD25OH  No results found for: PREALBUMIN CBC EXTENDED Latest Ref Rng & Units 09/30/2019 01/26/2015  WBC 4.0 - 10.5 K/uL 7.8 8.2  RBC 4.22 - 5.81 MIL/uL 5.68 5.45  HGB 13.0 - 17.0 g/dL 17.4(H) 17.2(H)  HCT 39.0 - 52.0 % 50.9 48.9  PLT 150 - 400 K/uL 270 236  NEUTROABS 1.7 - 7.7 K/uL - 4.6  LYMPHSABS 0.7 - 4.0 K/uL - 2.5       Body mass index is 29.29 kg/m.  Orders:  No orders of the defined types were placed in this encounter.  Meds ordered this encounter  Medications  . oxyCODONE-acetaminophen (PERCOCET) 5-325 MG tablet    Sig: Take 1 tablet by mouth every 4 (four) hours as needed for severe pain.    Dispense:  20 tablet    Refill:  0     Procedures: No procedures performed  Clinical Data: No additional findings.  ROS:  All other systems negative, except as noted in the HPI. Review of Systems  Objective: Vital Signs: Ht 5\' 11"  (1.803 m)   Wt 210 lb (95.3 kg)   BMI 29.29 kg/m   Specialty Comments:  No specialty comments available.  PMFS History: Patient Active Problem List   Diagnosis Date Noted  . Trimalleolar fracture of ankle, closed, right, initial encounter   . Ankle syndesmosis disruption, right, initial encounter   . Diabetic peripheral neuropathy associated with type 2 diabetes mellitus ([place]) 11/11/2017  . Hypercholesterolemia 11/11/2017   Past Medical History:  Diagnosis Date  . Diabetes mellitus without complication ([place])     Not taking DM meds- "I could not work and take DM meds."    Family History  Problem  Relation Age of Onset  . Diabetes Neg Hx     Past Surgical History:  Procedure Laterality Date  . ORIF ANKLE FRACTURE Right 09/30/2019   Procedure: OPEN REDUCTION INTERNAL FIXATION (ORIF) RIGHT TRIMALLEOLAR ANKLE FRACTURE AND SYNDESMOSIS REPAIR;  Surgeon: Nadara Mustard, MD;  Location: [facility];  Service: Orthopedics;  Laterality: Right;   Social History   Occupational History  . Not on file  Tobacco Use  . Smoking status: Former Smoker    Types: Cigarettes  . Smokeless tobacco: Never Used  . Tobacco comment: "as a teenager on the back of the bus"  Substance and Sexual Activity  . Alcohol use: Yes  . Drug use: No  . Sexual activity: Yes

## 2019-10-13 ENCOUNTER — Other Ambulatory Visit: Payer: Self-pay

## 2019-10-13 ENCOUNTER — Ambulatory Visit (INDEPENDENT_AMBULATORY_CARE_PROVIDER_SITE_OTHER): Payer: 59 | Admitting: Orthopedic Surgery

## 2019-10-13 ENCOUNTER — Encounter: Payer: Self-pay | Admitting: Physician Assistant

## 2019-10-13 ENCOUNTER — Ambulatory Visit: Payer: Self-pay

## 2019-10-13 VITALS — Ht 71.0 in | Wt 210.0 lb

## 2019-10-13 DIAGNOSIS — S82851A Displaced trimalleolar fracture of right lower leg, initial encounter for closed fracture: Secondary | ICD-10-CM | POA: Diagnosis not present

## 2019-10-13 MED ORDER — OXYCODONE-ACETAMINOPHEN 5-325 MG PO TABS: 1.0000 | ORAL_TABLET | ORAL | 0 refills | Status: DC | PRN

## 2019-10-13 MED ORDER — DOXYCYCLINE HYCLATE 100 MG PO TABS: 100.0000 mg | ORAL_TABLET | Freq: Two times a day (BID) | ORAL | 0 refills | Status: DC

## 2019-10-13 NOTE — Progress Notes (Signed)
Office Visit Note   Patient: Cameron Mccarthy           Date of Birth: April 01, 1971           MRN: [id number] Visit Date: 10/13/2019              Requested by: No referring provider defined for this encounter. PCP: Patient, No Pcp Per  Chief Complaint  Patient presents with  . Right Ankle - Routine Post Op    09/30/19 ORIF right ankle fx       HPI: Patient is a 49 year old gentleman is 2 weeks status post right ankle fracture with segmental fracture of the fibula posterior malleolus fracture syndesmosis injury as well as this year medial malleolar fracture.  Assessment & Plan: Visit Diagnoses:  1. Trimalleolar fracture of ankle, closed, right, initial encounter     Plan: Patient will start working on ankle active range of motion with maximizing dorsiflexion.  He may begin wound care he is given a prescription for compression sock he will wear around-the-clock.  Continue nonweightbearing for 3 weeks.  Follow-up in 3 weeks three-view radiographs of the right ankle at which time anticipate we can begin weightbearing.  Follow-Up Instructions: Return in about 3 weeks (around 11/03/2019).   Ortho Exam  Patient is alert, oriented, no adenopathy, well-dressed, normal affect, normal respiratory effort. Examination the incisions are healing nicely we will harvest the sutures today the distal lateral malleolar incision does have some cellulitis and a prescription is called in for doxycycline.  The ischemic area over the medial malleolus from the fracture dislocation has resolved with no ischemic skin changes.  Imaging: XR Ankle Complete Right  Result Date: 10/13/2019 Three-view radiographs of the right ankle shows the fibula is out to length the mortise is congruent the syndesmosis is reduced and the posterior malleolar fracture is reduced.  No images are attached to the encounter.  Labs: Lab Results  Component Value Date   HGBA1C 10.7 (A) 11/11/2017     Lab Results  Component  Value Date   ALBUMIN 4.0 11/11/2017   ALBUMIN 3.8 01/26/2015    No results found for: MG No results found for: VD25OH  No results found for: PREALBUMIN CBC EXTENDED Latest Ref Rng & Units 09/30/2019 01/26/2015  WBC 4.0 - 10.5 K/uL 7.8 8.2  RBC 4.22 - 5.81 MIL/uL 5.68 5.45  HGB 13.0 - 17.0 g/dL 17.4(H) 17.2(H)  HCT 39 - 52 % 50.9 48.9  PLT 150 - 400 K/uL 270 236  NEUTROABS 1.7 - 7.7 K/uL - 4.6  LYMPHSABS 0.7 - 4.0 K/uL - 2.5     Body mass index is 29.29 kg/m.  Orders:  Orders Placed This Encounter  Procedures  . XR Ankle Complete Right   No orders of the defined types were placed in this encounter.    Procedures: No procedures performed  Clinical Data: No additional findings.  ROS:  All other systems negative, except as noted in the HPI. Review of Systems  Objective: Vital Signs: Ht 5\' 11"  (1.803 m)   Wt 210 lb (95.3 kg)   BMI 29.29 kg/m   Specialty Comments:  No specialty comments available.  PMFS History: Patient Active Problem List   Diagnosis Date Noted  . Trimalleolar fracture of ankle, closed, right, initial encounter   . Ankle syndesmosis disruption, right, initial encounter   . Diabetic peripheral neuropathy associated with type 2 diabetes mellitus ([place]) 11/11/2017  . Hypercholesterolemia 11/11/2017   Past Medical History:  Diagnosis Date  .  Diabetes mellitus without complication ([facility])     Not taking DM meds- "I could not work and take DM meds."    Family History  Problem Relation Age of Onset  . Diabetes Neg Hx     Past Surgical History:  Procedure Laterality Date  . ORIF ANKLE FRACTURE Right 09/30/2019   Procedure: OPEN REDUCTION INTERNAL FIXATION (ORIF) RIGHT TRIMALLEOLAR ANKLE FRACTURE AND SYNDESMOSIS REPAIR;  Surgeon: Nadara Mustard, MD;  Location: [facility] OR;  Service: Orthopedics;  Laterality: Right;   Social History   Occupational History  . Not on file  Tobacco Use  . Smoking status: Former Smoker    Types: Cigarettes  . Smokeless  tobacco: Never Used  . Tobacco comment: "as a teenager on the back of the bus"  Vaping Use  . Vaping Use: Never used  Substance and Sexual Activity  . Alcohol use: Yes  . Drug use: No  . Sexual activity: Yes

## 2019-10-28 ENCOUNTER — Ambulatory Visit (INDEPENDENT_AMBULATORY_CARE_PROVIDER_SITE_OTHER): Payer: 59 | Admitting: Physician Assistant

## 2019-10-28 ENCOUNTER — Other Ambulatory Visit: Payer: Self-pay

## 2019-10-28 ENCOUNTER — Encounter: Payer: Self-pay | Admitting: Physician Assistant

## 2019-10-28 ENCOUNTER — Ambulatory Visit (INDEPENDENT_AMBULATORY_CARE_PROVIDER_SITE_OTHER): Payer: 59

## 2019-10-28 VITALS — Ht 71.0 in | Wt 210.0 lb

## 2019-10-28 DIAGNOSIS — S82851A Displaced trimalleolar fracture of right lower leg, initial encounter for closed fracture: Secondary | ICD-10-CM

## 2019-10-28 NOTE — Progress Notes (Signed)
Office Visit Note   Patient: Cameron Mccarthy           Date of Birth: 10/31/1970           MRN: [id number] Visit Date: 10/28/2019              Requested by: No referring provider defined for this encounter. PCP: Patient, No Pcp Per  Chief Complaint  Patient presents with  . Right Ankle - Routine Post Op    09/30/19 ORIF right ankle fx         HPI: This is a pleasant 49 year old gentleman who is 4 weeks status post ORIF of a right trimalleolar ankle fracture.  As well as syndesmosis.  He has been wearing a compression sock.  He was concerned yesterday because he had some increased redness and swelling.  That has resolved today but he wanted to be sure that he did not have an infection.  He has not been taking any pain medication for 3 days he does admit that although he is not doing much he was having his leg in a dependent position.  Denies fever chills or calf pain  Assessment & Plan: Visit Diagnoses:  1. Trimalleolar fracture of ankle, closed, right, initial encounter     Plan: 4 weeks status post above, doing very well.  Findings consistent yesterday with inflammation.  I could not find no evidence today for signs or symptoms of an infective process.  If things change if he has persistent redness and swelling or increased pain to fight despite elevating his leg he will contact us.  Otherwise follow-up in 4 weeks.  He may begin weightbearing as tolerated in his boot in 1 week.  He is anticipating a cross-country trip and I have asked that he take a baby aspirin until he returns home x-rays at his next visit  Follow-Up Instructions: No follow-ups on file.   Ortho Exam  Patient is alert, oriented, no adenopathy, well-dressed, normal affect, normal respiratory effort. Right ankle: Well-healed surgical incision.  No erythema no cellulitis he has good ankle range of motion to past neutral without any pain.  Nontender to palpation.  Mild soft tissue swelling compartments are soft and  compressible  Imaging: No results found. No images are attached to the encounter.  Labs: Lab Results  Component Value Date   HGBA1C 10.7 (A) 11/11/2017     Lab Results  Component Value Date   ALBUMIN 4.0 11/11/2017   ALBUMIN 3.8 01/26/2015    No results found for: MG No results found for: VD25OH  No results found for: PREALBUMIN CBC EXTENDED Latest Ref Rng & Units 09/30/2019 01/26/2015  WBC 4.0 - 10.5 K/uL 7.8 8.2  RBC 4.22 - 5.81 MIL/uL 5.68 5.45  HGB 13.0 - 17.0 g/dL 17.4(H) 17.2(H)  HCT 39 - 52 % 50.9 48.9  PLT 150 - 400 K/uL 270 236  NEUTROABS 1.7 - 7.7 K/uL - 4.6  LYMPHSABS 0.7 - 4.0 K/uL - 2.5     Body mass index is 29.29 kg/m.  Orders:  Orders Placed This Encounter  Procedures  . XR Ankle Complete Right   No orders of the defined types were placed in this encounter.    Procedures: No procedures performed  Clinical Data: No additional findings.  ROS:  All other systems negative, except as noted in the HPI. Review of Systems  Objective: Vital Signs: Ht 5\' 11"  (1.803 m)   Wt 210 lb (95.3 kg)   BMI 29.29 kg/m  Specialty Comments:  No specialty comments available.  PMFS History: Patient Active Problem List   Diagnosis Date Noted  . Trimalleolar fracture of ankle, closed, right, initial encounter   . Ankle syndesmosis disruption, right, initial encounter   . Diabetic peripheral neuropathy associated with type 2 diabetes mellitus ([facility]) 11/11/2017  . Hypercholesterolemia 11/11/2017   Past Medical History:  Diagnosis Date  . Diabetes mellitus without complication ([facility])     Not taking DM meds- "I could not work and take DM meds."    Family History  Problem Relation Age of Onset  . Diabetes Neg Hx     Past Surgical History:  Procedure Laterality Date  . ORIF ANKLE FRACTURE Right 09/30/2019   Procedure: OPEN REDUCTION INTERNAL FIXATION (ORIF) RIGHT TRIMALLEOLAR ANKLE FRACTURE AND SYNDESMOSIS REPAIR;  Surgeon: Nadara Mustard, MD;  Location:  [facility] OR;  Service: Orthopedics;  Laterality: Right;   Social History   Occupational History  . Not on file  Tobacco Use  . Smoking status: Former Smoker    Types: Cigarettes  . Smokeless tobacco: Never Used  . Tobacco comment: "as a teenager on the back of the bus"  Vaping Use  . Vaping Use: Never used  Substance and Sexual Activity  . Alcohol use: Yes  . Drug use: No  . Sexual activity: Yes

## 2019-11-03 ENCOUNTER — Ambulatory Visit: Payer: 59 | Admitting: Physician Assistant

## 2019-11-04 ENCOUNTER — Telehealth: Payer: Self-pay | Admitting: Orthopedic Surgery

## 2019-11-04 ENCOUNTER — Other Ambulatory Visit: Payer: Self-pay | Admitting: Physician Assistant

## 2019-11-04 MED ORDER — OXYCODONE-ACETAMINOPHEN 5-325 MG PO TABS: 1.0000 | ORAL_TABLET | ORAL | 0 refills | Status: AC | PRN

## 2019-11-04 NOTE — Telephone Encounter (Signed)
Patient's wife called.   She is requesting that the patient receive a refill on his oxycodone.   Call back: [phone]

## 2019-11-04 NOTE — Telephone Encounter (Signed)
Refill done.  

## 2019-11-04 NOTE — Telephone Encounter (Signed)
09/30/19 right ankle ORIF requesting refill on Oxycodone 5/325 last refill was 10/13/19 #30

## 2019-11-25 ENCOUNTER — Ambulatory Visit (INDEPENDENT_AMBULATORY_CARE_PROVIDER_SITE_OTHER): Payer: 59

## 2019-11-25 ENCOUNTER — Encounter: Payer: Self-pay | Admitting: Physician Assistant

## 2019-11-25 ENCOUNTER — Ambulatory Visit (INDEPENDENT_AMBULATORY_CARE_PROVIDER_SITE_OTHER): Payer: 59 | Admitting: Physician Assistant

## 2019-11-25 VITALS — Ht 71.0 in | Wt 210.0 lb

## 2019-11-25 DIAGNOSIS — S82851A Displaced trimalleolar fracture of right lower leg, initial encounter for closed fracture: Secondary | ICD-10-CM | POA: Diagnosis not present

## 2019-11-25 DIAGNOSIS — M25561 Pain in right knee: Secondary | ICD-10-CM | POA: Diagnosis not present

## 2019-11-25 NOTE — Progress Notes (Signed)
Office Visit Note   Patient: Cameron Mccarthy           Date of Birth: 06/29/1970           MRN: [id number] Visit Date: 11/25/2019              Requested by: No referring provider defined for this encounter. PCP: Patient, No Pcp Per  Chief Complaint  Patient presents with  . Right Ankle - Routine Post Op    09/30/19 right ankle ORIF trimal ankle fx   . Right Knee - Pain      HPI: This is a pleasant gentleman who is now almost 2 months status post right ankle open reduction internal fixation with syndesmosis fixation.  He is weightbearing in a regular shoe.  He feels he is doing much better he occasionally gets some swelling and achiness but he is getting back to most of his activities.  He is complaining of some medial knee pain that he thinks he may have injured his knee in the original accident.  He denies any instability or locking but points to pain over the medial side of the knee  Assessment & Plan: Visit Diagnoses:  1. Trimalleolar fracture of ankle, closed, right, initial encounter   2. Acute pain of right knee     Plan: Patient should work on a Ship broker of his right lower extremity.  We discussed physical therapy and he is decided he would like to try and strengthen his lower extremity on his own.  If he has any doubts that he can do this or has trouble he will contact us will order formal physical therapy.  He will follow-up for final time in 6 weeks.  We discussed possible removal of hardware if it becomes symptomatic.  Certainly this is not an issue to him now.  Knee findings either consistent with a compression injury to the medial meniscus but given the valgus stress he describes at the time of injury may also be an MCL sprain  Follow-Up Instructions: No follow-ups on file.   Ortho Exam  Patient is alert, oriented, no adenopathy, well-dressed, normal affect, normal respiratory effort. Right ankle: Well-healed surgical incisions.  Mild soft tissue  swelling.  Distal CMS is intact.  He does have fairly good ankle range of motion.  Compartments are soft and compressible.  No sign of infection. Right knee he does have some tenderness over the medial joint line but also over the proximal and distal insertion of the medial collateral ligament.  No effusion no instability  Imaging: No results found. No images are attached to the encounter.  Labs: Lab Results  Component Value Date   HGBA1C 10.7 (A) 11/11/2017     Lab Results  Component Value Date   ALBUMIN 4.0 11/11/2017   ALBUMIN 3.8 01/26/2015    No results found for: MG No results found for: VD25OH  No results found for: PREALBUMIN CBC EXTENDED Latest Ref Rng & Units 09/30/2019 01/26/2015  WBC 4.0 - 10.5 K/uL 7.8 8.2  RBC 4.22 - 5.81 MIL/uL 5.68 5.45  HGB 13.0 - 17.0 g/dL 17.4(H) 17.2(H)  HCT 39 - 52 % 50.9 48.9  PLT 150 - 400 K/uL 270 236  NEUTROABS 1.7 - 7.7 K/uL - 4.6  LYMPHSABS 0.7 - 4.0 K/uL - 2.5     Body mass index is 29.29 kg/m.  Orders:  Orders Placed This Encounter  Procedures  . XR Ankle Complete Right  . XR Knee 1-2 Views Right  No orders of the defined types were placed in this encounter.    Procedures: No procedures performed  Clinical Data: No additional findings.  ROS:  All other systems negative, except as noted in the HPI. Review of Systems  Objective: Vital Signs: Ht 5\' 11"  (1.803 m)   Wt (!) 210 lb (95.3 kg)   BMI 29.29 kg/m   Specialty Comments:  No specialty comments available.  PMFS History: Patient Active Problem List   Diagnosis Date Noted  . Trimalleolar fracture of ankle, closed, right, initial encounter   . Ankle syndesmosis disruption, right, initial encounter   . Diabetic peripheral neuropathy associated with type 2 diabetes mellitus (HCC) 11/11/2017  . Hypercholesterolemia 11/11/2017   Past Medical History:  Diagnosis Date  . Diabetes mellitus without complication (HCC)     Not taking DM meds- "I could not  work and take DM meds."    Family History  Problem Relation Age of Onset  . Diabetes Neg Hx     Past Surgical History:  Procedure Laterality Date  . ORIF ANKLE FRACTURE Right 09/30/2019   Procedure: OPEN REDUCTION INTERNAL FIXATION (ORIF) RIGHT TRIMALLEOLAR ANKLE FRACTURE AND SYNDESMOSIS REPAIR;  Surgeon: 11/30/2019, MD;  Location: [facility] OR;  Service: Orthopedics;  Laterality: Right;   Social History   Occupational History  . Not on file  Tobacco Use  . Smoking status: Former Smoker    Types: Cigarettes  . Smokeless tobacco: Never Used  . Tobacco comment: "as a teenager on the back of the bus"  Vaping Use  . Vaping Use: Never used  Substance and Sexual Activity  . Alcohol use: Yes  . Drug use: No  . Sexual activity: Yes

## 2020-01-06 ENCOUNTER — Ambulatory Visit (INDEPENDENT_AMBULATORY_CARE_PROVIDER_SITE_OTHER): Payer: 59 | Admitting: Physician Assistant

## 2020-01-06 ENCOUNTER — Encounter: Payer: Self-pay | Admitting: Physician Assistant

## 2020-01-06 DIAGNOSIS — S82851A Displaced trimalleolar fracture of right lower leg, initial encounter for closed fracture: Secondary | ICD-10-CM

## 2020-01-06 NOTE — Progress Notes (Signed)
Office Visit Note   Patient: Cameron Mccarthy           Date of Birth: 03/15/1971           MRN: [id number] Visit Date: 01/06/2020              Requested by: No referring provider defined for this encounter. PCP: Patient, No Pcp Per  No chief complaint on file.     HPI: The patient is now 3 months status post open reduction internal fixation of a trimalleolar ankle fracture he is wearing a compression stocking.  He does notice that he gets more swelling when he is about noon time every day.  Denies any fever chills or calf pain.  He is working on Print production planner.  He still feels he is having difficulty with balance   Assessment & Plan: Visit Diagnoses: No diagnosis found.  Plan: Some proprioceptive and fascial strengthening exercises were demonstrated to him today.  He will continue to work on his own but I told him if he needs physical therapy he may contact us.  Follow-up in 2 months if he still has difficulties  Follow-Up Instructions: No follow-ups on file.   Ortho Exam  Patient is alert, oriented, no adenopathy, well-dressed, normal affect, normal respiratory effort. Focused examination demonstrates well-healed surgical incisions.  Mild soft tissue swelling.  No cellulitis.  He does have good strength with dorsiflexion plantarflexion eversion of inversion.  He has dorsiflexion to 15 degrees past neutral  Imaging: No results found. No images are attached to the encounter.  Labs: Lab Results  Component Value Date   HGBA1C 10.7 (A) 11/11/2017     Lab Results  Component Value Date   ALBUMIN 4.0 11/11/2017   ALBUMIN 3.8 01/26/2015    No results found for: MG No results found for: VD25OH  No results found for: PREALBUMIN CBC EXTENDED Latest Ref Rng & Units 09/30/2019 01/26/2015  WBC 4.0 - 10.5 K/uL 7.8 8.2  RBC 4.22 - 5.81 MIL/uL 5.68 5.45  HGB 13.0 - 17.0 g/dL 17.4(H) 17.2(H)  HCT 39 - 52 % 50.9 48.9  PLT 150 - 400 K/uL 270 236  NEUTROABS 1.7 - 7.7 K/uL - 4.6    LYMPHSABS 0.7 - 4.0 K/uL - 2.5     There is no height or weight on file to calculate BMI.  Orders:  No orders of the defined types were placed in this encounter.  No orders of the defined types were placed in this encounter.    Procedures: No procedures performed  Clinical Data: No additional findings.  ROS:  All other systems negative, except as noted in the HPI. Review of Systems  Objective: Vital Signs: There were no vitals taken for this visit.  Specialty Comments:  No specialty comments available.  PMFS History: Patient Active Problem List   Diagnosis Date Noted  . Trimalleolar fracture of ankle, closed, right, initial encounter   . Ankle syndesmosis disruption, right, initial encounter   . Diabetic peripheral neuropathy associated with type 2 diabetes mellitus (HCC) 11/11/2017  . Hypercholesterolemia 11/11/2017   Past Medical History:  Diagnosis Date  . Diabetes mellitus without complication (HCC)     Not taking DM meds- "I could not work and take DM meds."    Family History  Problem Relation Age of Onset  . Diabetes Neg Hx     Past Surgical History:  Procedure Laterality Date  . ORIF ANKLE FRACTURE Right 09/30/2019   Procedure: OPEN REDUCTION INTERNAL FIXATION (ORIF) RIGHT  TRIMALLEOLAR ANKLE FRACTURE AND SYNDESMOSIS REPAIR;  Surgeon: Nadara Mustard, MD;  Location: [facility] OR;  Service: Orthopedics;  Laterality: Right;   Social History   Occupational History  . Not on file  Tobacco Use  . Smoking status: Former Smoker    Types: Cigarettes  . Smokeless tobacco: Never Used  . Tobacco comment: "as a teenager on the back of the bus"  Vaping Use  . Vaping Use: Never used  Substance and Sexual Activity  . Alcohol use: Yes  . Drug use: No  . Sexual activity: Yes

## 2020-12-27 ENCOUNTER — Other Ambulatory Visit: Payer: Self-pay

## 2020-12-27 ENCOUNTER — Ambulatory Visit (INDEPENDENT_AMBULATORY_CARE_PROVIDER_SITE_OTHER): Payer: 59

## 2020-12-27 ENCOUNTER — Ambulatory Visit (HOSPITAL_COMMUNITY)
Admission: EM | Admit: 2020-12-27 | Discharge: 2020-12-27 | Disposition: A | Discharge status: Home / Self Care | Payer: 59 | Attending: Physician Assistant | Admitting: Physician Assistant

## 2020-12-27 ENCOUNTER — Ambulatory Visit
Admission: EM | Admit: 2020-12-27 | Discharge: 2020-12-27 | Disposition: A | Discharge status: Home / Self Care | Payer: 59 | Attending: Emergency Medicine | Admitting: Emergency Medicine

## 2020-12-27 DIAGNOSIS — M79631 Pain in right forearm: Secondary | ICD-10-CM | POA: Diagnosis not present

## 2020-12-27 DIAGNOSIS — S52202A Unspecified fracture of shaft of left ulna, initial encounter for closed fracture: Secondary | ICD-10-CM | POA: Diagnosis not present

## 2020-12-27 DIAGNOSIS — W5512XA Struck by horse, initial encounter: Secondary | ICD-10-CM | POA: Diagnosis not present

## 2020-12-27 DIAGNOSIS — M25521 Pain in right elbow: Secondary | ICD-10-CM | POA: Diagnosis not present

## 2020-12-27 MED ORDER — HYDROCODONE-ACETAMINOPHEN 5-325 MG PO TABS: 1.0000 | ORAL_TABLET | Freq: Four times a day (QID) | ORAL | 0 refills | Status: DC | PRN

## 2020-12-27 MED ORDER — IBUPROFEN 600 MG PO TABS
600.0000 mg | ORAL_TABLET | Freq: Four times a day (QID) | ORAL | 0 refills | Status: DC | PRN
Start: 2020-12-27 — End: 2020-12-27

## 2020-12-27 NOTE — ED Provider Notes (Signed)
HPI  SUBJECTIVE:  Cameron Mccarthy is a right-handed 50 y.o. male who presents with constant, sharp, right forearm pain, swelling after being kicked in the right distal forearm along the ulnar side by a horse earlier today.  He states it radiates into his wrist.  He has difficulty moving his fingers and grip weakness secondary to the pain.  Unsure if there is any bruising.  No injury to the elbow, numbness or tingling in the fingers.  He tried ibuprofen 600 to 800 mg without improvement in his symptoms.  Symptoms worse with palpation, moving his fingers, flexing and extending the elbow.  He has a past medical history of diabetes.  Denies history of peripheral neuropathy.  PMD: None.  Orthopedics: Dr. Lajoyce Corners    Past Medical History:  Diagnosis Date   Diabetes mellitus without complication (HCC)     Not taking DM meds- "I could not work and take DM meds."    Past Surgical History:  Procedure Laterality Date   ORIF ANKLE FRACTURE Right 09/30/2019   Procedure: OPEN REDUCTION INTERNAL FIXATION (ORIF) RIGHT TRIMALLEOLAR ANKLE FRACTURE AND SYNDESMOSIS REPAIR;  Surgeon: Nadara Mustard, MD;  Location: [facility] OR;  Service: Orthopedics;  Laterality: Right;    Family History  Problem Relation Age of Onset   Diabetes Neg Hx     Social History   Tobacco Use   Smoking status: Former    Types: Cigarettes   Smokeless tobacco: Never   Tobacco comments:    "as a teenager on the back of the bus"  Vaping Use   Vaping Use: Never used  Substance Use Topics   Alcohol use: Yes   Drug use: No    No current facility-administered medications for this encounter.  Current Outpatient Medications:    HYDROcodone-acetaminophen (NORCO/VICODIN) 5-325 MG tablet, Take 1-2 tablets by mouth every 6 (six) hours as needed for moderate pain or severe pain., Disp: 12 tablet, Rfl: 0  No Known Allergies   ROS  As noted in HPI.   Physical Exam  BP (!) 164/94 (BP Location: Left Arm)   Pulse 82   Temp 97.7 F (36.5  C) (Oral)   Resp 18   SpO2 97%   Constitutional: Well developed, well nourished, no acute distress Eyes:  EOMI, conjunctiva normal bilaterally HENT: Normocephalic, atraumatic,mucus membranes moist Respiratory: Normal inspiratory effort Cardiovascular: Normal rate GI: nondistended skin: No rash, skin intact Musculoskeletal: Right arm: Point tenderness at the distal ulna.  No tenderness at the TFCC.  No tenderness at the hand, distal radius, along the radius, radial head, elbow.  Patient has full flexion-extension at the elbow.  Motor and sensation intact in the median/radial/ulnar distribution.  RP 2+.  Positive abrasion in the area of pain.  No appreciable bruising.  Compartments soft. Neurologic: Alert & oriented x 3, no focal neuro deficits Psychiatric: Speech and behavior appropriate   ED Course   Medications - No data to display  Orders Placed This Encounter  Procedures   Splint application    Double sugar-tong splint    Standing Status:   Future    Standing Expiration Date:   12/28/2020   DG Forearm Right    Standing Status:   Standing    Number of Occurrences:   1    Order Specific Question:   Reason for Exam (SYMPTOM  OR DIAGNOSIS REQUIRED)    Answer:   kicke by a horse and limited ROM    Order Specific Question:   Release to patient  Answer:   Immediate   DG Elbow Complete Right    Standing Status:   Standing    Number of Occurrences:   1    Order Specific Question:   Reason for Exam (SYMPTOM  OR DIAGNOSIS REQUIRED)    Answer:   pt with likely ulnar fx, r/o radial head dislocation    No results found for this or any previous visit (from the past 24 hour(s)). DG Elbow Complete Right  Result Date: 12/27/2020 CLINICAL DATA:  Right elbow pain after being kicked by a horse. EXAM: RIGHT ELBOW - COMPLETE 3+ VIEW COMPARISON:  None. FINDINGS: There is no evidence of fracture, dislocation, or joint effusion. There is no evidence of arthropathy or other focal bone  abnormality. Soft tissues are unremarkable. IMPRESSION: Negative. Electronically Signed   By: Lupita Raider M.D.   On: 12/27/2020 17:51   DG Forearm Right  Result Date: 12/27/2020 CLINICAL DATA:  Kicked by a horse today, limited range of motion, RIGHT forearm pain EXAM: RIGHT FOREARM - 2 VIEW COMPARISON:  None FINDINGS: Osseous mineralization normal. Joint alignments normal. Nondisplaced fracture of the mid to distal RIGHT ulnar diaphysis. On lateral view, subtle cortical lucency is seen at the mid to distal RIGHT radial diaphysis, external to the ulnar diaphysis, question nondisplaced radial diaphyseal fracture, not well demonstrated on AP view. No additional fracture or dislocation seen. IMPRESSION: Nondisplaced mid to distal RIGHT ulnar diaphyseal fracture. Questionable nondisplaced RIGHT radial diaphyseal fracture. Electronically Signed   By: Ulyses Southward M.D.   On: 12/27/2020 17:05    ED Clinical Impression  1. Closed fracture of shaft of left ulna, unspecified fracture morphology, initial encounter      ED Assessment/Plan  [place] Narcotic database reviewed for this patient, and feel that the risk/benefit ratio today is favorable for proceeding with a prescription for controlled substance.  No opiate prescriptions since 2021.  Reviewed imaging independently.  Nondisplaced mid to distal right ulnar diaphyseal fracture.  Questionable nondisplaced right radial diaphyseal fracture.  Normal joint alignments.  No fracture, dislocation of the elbow.  See radiology report for full details.  While patient has no tenderness along the elbow, elbow radiographs are recommended to rule out radial head dislocation.  will check elbow.    No evidence of compartment syndrome.  He is neurovascularly intact in median/radial/ulnar distribution.  Discussed with Dr. Eulah Pont, orthopedist on-call. Placing him in a double sugar-tong splint per Ortho recommendation.  Will send home with Tylenol/Norco.  He will see  patient in the office tomorrow at 830.  Patient also states that he has an appointment scheduled with Dr. Lajoyce Corners tomorrow at 845.  Discussed with him to cancel 1 of these appointments.  Discussed  imaging, MDM, treatment plan, and plan for follow-up with patient. Discussed sn/sx that should prompt return to the ED. patient agrees with plan.   Meds ordered this encounter  Medications   DISCONTD: HYDROcodone-acetaminophen (NORCO/VICODIN) 5-325 MG tablet    Sig: Take 1-2 tablets by mouth every 6 (six) hours as needed for moderate pain or severe pain.    Dispense:  12 tablet    Refill:  0   DISCONTD: ibuprofen (ADVIL) 600 MG tablet    Sig: Take 1 tablet (600 mg total) by mouth every 6 (six) hours as needed.    Dispense:  30 tablet    Refill:  0   HYDROcodone-acetaminophen (NORCO/VICODIN) 5-325 MG tablet    Sig: Take 1-2 tablets by mouth every 6 (six) hours as needed for  moderate pain or severe pain.    Dispense:  12 tablet    Refill:  0       *This clinic note was created using Scientist, clinical (histocompatibility and immunogenetics). Therefore, there may be occasional mistakes despite careful proofreading.  ?    Domenick Gong, MD 12/28/20 [phone]

## 2020-12-27 NOTE — ED Triage Notes (Signed)
Today, Pt was kicked in his right forearm by horse causing a sudden onset of pain and swelling. Notes limited ROM. Has been taking ibuprofen with minimal relief. No falls.

## 2020-12-27 NOTE — ED Triage Notes (Signed)
Pt presents for ortho splint only.

## 2020-12-27 NOTE — Discharge Instructions (Addendum)
Your elbow x-ray is normal.  You Have broken the ulna and  the radius.  I am placing you in a double sugar-tong splint.  Go to the [facility] urgent care to have this done by orthopedic tech.  Your elbow x-ray was normal.  I have talked to Dr. Eulah Pont, orthopedist on-call.  He will see you in the office tomorrow at 830.  Or you can see Dr. Lajoyce Corners.  Please call one of them and cancel your appointment tomorrow.

## 2020-12-28 ENCOUNTER — Ambulatory Visit (INDEPENDENT_AMBULATORY_CARE_PROVIDER_SITE_OTHER): Payer: 59 | Admitting: Family

## 2020-12-28 ENCOUNTER — Encounter: Payer: Self-pay | Admitting: Family

## 2020-12-28 DIAGNOSIS — S52254A Nondisplaced comminuted fracture of shaft of ulna, right arm, initial encounter for closed fracture: Secondary | ICD-10-CM | POA: Diagnosis not present

## 2020-12-28 MED ORDER — HYDROCODONE-ACETAMINOPHEN 5-325 MG PO TABS: 1.0000 | ORAL_TABLET | ORAL | 0 refills | Status: DC | PRN

## 2020-12-28 NOTE — Progress Notes (Signed)
Office Visit Note   Patient: Cameron Mccarthy           Date of Birth: 1970-09-24           MRN: [id number] Visit Date: 12/28/2020              Requested by: No referring provider defined for this encounter. PCP: Patient, No Pcp Per (Inactive)  Chief Complaint  Patient presents with   Right Forearm - Pain      HPI: The patient is a 50 year old gentleman seen today for initial evaluation of injury to his right forearm.  He was kicked by a horse yesterday.  He was subsequently seen in the emergency department and placed in a sugar-tong splint.  He is complaining of some worse pain today mild swelling  He states he has plans to be out of town for the majority of the next 4 weeks.  The sugar-tong splint is uncomfortable.  He does not feel safe or stable in it  Assessment & Plan: Visit Diagnoses:  1. Nondisplaced comminuted fracture of shaft of ulna, right arm, initial encounter for closed fracture     Plan: Placed in a long-arm cast discussed return precautions discussed swelling in the cast.  We will call in a prescription for pain medication plan to follow-up with him in the next few weeks as soon as he is able to get back in town  Follow-Up Instructions: Return in about 2 weeks (around 01/11/2021).   Ortho Exam  Patient is alert, oriented, no adenopathy, well-dressed, normal affect, normal respiratory effort. On examination of the right wrist and forearm moderate edema no erythema there is palpable radial pulse.  Able to wiggle fingers.  Elbow nontender  Imaging: DG Elbow Complete Right  Result Date: 12/27/2020 CLINICAL DATA:  Right elbow pain after being kicked by a horse. EXAM: RIGHT ELBOW - COMPLETE 3+ VIEW COMPARISON:  None. FINDINGS: There is no evidence of fracture, dislocation, or joint effusion. There is no evidence of arthropathy or other focal bone abnormality. Soft tissues are unremarkable. IMPRESSION: Negative. Electronically Signed   By: Lupita Raider M.D.    On: 12/27/2020 17:51   DG Forearm Right  Result Date: 12/27/2020 CLINICAL DATA:  Kicked by a horse today, limited range of motion, RIGHT forearm pain EXAM: RIGHT FOREARM - 2 VIEW COMPARISON:  None FINDINGS: Osseous mineralization normal. Joint alignments normal. Nondisplaced fracture of the mid to distal RIGHT ulnar diaphysis. On lateral view, subtle cortical lucency is seen at the mid to distal RIGHT radial diaphysis, external to the ulnar diaphysis, question nondisplaced radial diaphyseal fracture, not well demonstrated on AP view. No additional fracture or dislocation seen. IMPRESSION: Nondisplaced mid to distal RIGHT ulnar diaphyseal fracture. Questionable nondisplaced RIGHT radial diaphyseal fracture. Electronically Signed   By: Ulyses Southward M.D.   On: 12/27/2020 17:05   No images are attached to the encounter.  Labs: Lab Results  Component Value Date   HGBA1C 10.7 (A) 11/11/2017     Lab Results  Component Value Date   ALBUMIN 4.0 11/11/2017   ALBUMIN 3.8 01/26/2015    No results found for: MG No results found for: VD25OH  No results found for: PREALBUMIN CBC EXTENDED Latest Ref Rng & Units 09/30/2019 01/26/2015  WBC 4.0 - 10.5 K/uL 7.8 8.2  RBC 4.22 - 5.81 MIL/uL 5.68 5.45  HGB 13.0 - 17.0 g/dL 17.4(H) 17.2(H)  HCT 39.0 - 52.0 % 50.9 48.9  PLT 150 - 400 K/uL 270 236  NEUTROABS 1.7 - 7.7 K/uL - 4.6  LYMPHSABS 0.7 - 4.0 K/uL - 2.5     There is no height or weight on file to calculate BMI.  Orders:  No orders of the defined types were placed in this encounter.  No orders of the defined types were placed in this encounter.    Procedures: No procedures performed  Clinical Data: No additional findings.  ROS:  All other systems negative, except as noted in the HPI. Review of Systems  Constitutional:  Negative for chills and fever.  Musculoskeletal:  Positive for arthralgias, joint swelling and myalgias.   Objective: Vital Signs: There were no vitals taken for this  visit.  Specialty Comments:  No specialty comments available.  PMFS History: Patient Active Problem List   Diagnosis Date Noted   Trimalleolar fracture of ankle, closed, right, initial encounter    Ankle syndesmosis disruption, right, initial encounter    Diabetic peripheral neuropathy associated with type 2 diabetes mellitus (HCC) 11/11/2017   Hypercholesterolemia 11/11/2017   Past Medical History:  Diagnosis Date   Diabetes mellitus without complication (HCC)     Not taking DM meds- "I could not work and take DM meds."    Family History  Problem Relation Age of Onset   Diabetes Neg Hx     Past Surgical History:  Procedure Laterality Date   ORIF ANKLE FRACTURE Right 09/30/2019   Procedure: OPEN REDUCTION INTERNAL FIXATION (ORIF) RIGHT TRIMALLEOLAR ANKLE FRACTURE AND SYNDESMOSIS REPAIR;  Surgeon: Nadara Mustard, MD;  Location: [facility] OR;  Service: Orthopedics;  Laterality: Right;   Social History   Occupational History   Not on file  Tobacco Use   Smoking status: Former    Types: Cigarettes   Smokeless tobacco: Never   Tobacco comments:    "as a teenager on the back of the bus"  Vaping Use   Vaping Use: Never used  Substance and Sexual Activity   Alcohol use: Yes   Drug use: No   Sexual activity: Yes

## 2021-01-31 ENCOUNTER — Encounter: Payer: Self-pay | Admitting: Family

## 2021-01-31 ENCOUNTER — Ambulatory Visit: Payer: Self-pay

## 2021-01-31 ENCOUNTER — Ambulatory Visit (INDEPENDENT_AMBULATORY_CARE_PROVIDER_SITE_OTHER): Payer: 59 | Admitting: Family

## 2021-01-31 DIAGNOSIS — S52254A Nondisplaced comminuted fracture of shaft of ulna, right arm, initial encounter for closed fracture: Secondary | ICD-10-CM

## 2021-01-31 NOTE — Progress Notes (Signed)
Office Visit Note   Patient: Cameron Mccarthy           Date of Birth: February 25, 1971           MRN: [id number] Visit Date: 01/31/2021              Requested by: No referring provider defined for this encounter. PCP: Patient, No Pcp Per (Inactive)  Chief Complaint  Patient presents with   Right Arm - Follow-up      HPI: The patient is a 50 year old gentleman seen in follow-up for right arm fracture he has been in a long-arm cast for the last 4 weeks.  He has been on the road and unable to follow-up any sooner.  He has done well. no concerns voiced  Assessment & Plan: Visit Diagnoses:  1. Nondisplaced comminuted fracture of shaft of ulna, right arm, initial encounter for closed fracture     Plan: Given a wrist splint.  He will limit his weightbearing of the right upper extremity for the next 4 weeks.  Work on range of motion of the hand and the wrist  Follow-Up Instructions: Return if symptoms worsen or fail to improve.   Right Hand Exam  Right hand exam is normal.  Other  Erythema: absent Sensation: normal Pulse: present  Comments:  Stiffness     Patient is alert, oriented, no adenopathy, well-dressed, normal affect, normal respiratory effort.   Imaging: No results found. No images are attached to the encounter.  Labs: Lab Results  Component Value Date   HGBA1C 10.7 (A) 11/11/2017     Lab Results  Component Value Date   ALBUMIN 4.0 11/11/2017   ALBUMIN 3.8 01/26/2015    No results found for: MG No results found for: VD25OH  No results found for: PREALBUMIN CBC EXTENDED Latest Ref Rng & Units 09/30/2019 01/26/2015  WBC 4.0 - 10.5 K/uL 7.8 8.2  RBC 4.22 - 5.81 MIL/uL 5.68 5.45  HGB 13.0 - 17.0 g/dL 17.4(H) 17.2(H)  HCT 39.0 - 52.0 % 50.9 48.9  PLT 150 - 400 K/uL 270 236  NEUTROABS 1.7 - 7.7 K/uL - 4.6  LYMPHSABS 0.7 - 4.0 K/uL - 2.5     There is no height or weight on file to calculate BMI.  Orders:  No orders of the defined types were placed  in this encounter.  No orders of the defined types were placed in this encounter.    Procedures: No procedures performed  Clinical Data: No additional findings.  ROS:  All other systems negative, except as noted in the HPI. Review of Systems  Objective: Vital Signs: There were no vitals taken for this visit.  Specialty Comments:  No specialty comments available.  PMFS History: Patient Active Problem List   Diagnosis Date Noted   Trimalleolar fracture of ankle, closed, right, initial encounter    Ankle syndesmosis disruption, right, initial encounter    Diabetic peripheral neuropathy associated with type 2 diabetes mellitus (HCC) 11/11/2017   Hypercholesterolemia 11/11/2017   Past Medical History:  Diagnosis Date   Diabetes mellitus without complication (HCC)     Not taking DM meds- "I could not work and take DM meds."    Family History  Problem Relation Age of Onset   Diabetes Neg Hx     Past Surgical History:  Procedure Laterality Date   ORIF ANKLE FRACTURE Right 09/30/2019   Procedure: OPEN REDUCTION INTERNAL FIXATION (ORIF) RIGHT TRIMALLEOLAR ANKLE FRACTURE AND SYNDESMOSIS REPAIR;  Surgeon: Nadara Mustard, MD;  Location: [facility] OR;  Service: Orthopedics;  Laterality: Right;   Social History   Occupational History   Not on file  Tobacco Use   Smoking status: Former    Types: Cigarettes   Smokeless tobacco: Never   Tobacco comments:    "as a teenager on the back of the bus"  Vaping Use   Vaping Use: Never used  Substance and Sexual Activity   Alcohol use: Yes   Drug use: No   Sexual activity: Yes

## 2021-08-03 ENCOUNTER — Ambulatory Visit (INDEPENDENT_AMBULATORY_CARE_PROVIDER_SITE_OTHER): Payer: Self-pay | Admitting: Internal Medicine

## 2021-08-03 ENCOUNTER — Ambulatory Visit
Admission: RE | Admit: 2021-08-03 | Discharge: 2021-08-03 | Disposition: A | Discharge status: Home / Self Care | Payer: 59 | Source: Ambulatory Visit | Attending: Internal Medicine | Admitting: Internal Medicine

## 2021-08-03 VITALS — BP 130/82 | HR 92 | Temp 97.8°F | Ht 71.0 in | Wt 206.0 lb

## 2021-08-03 DIAGNOSIS — R0989 Other specified symptoms and signs involving the circulatory and respiratory systems: Secondary | ICD-10-CM

## 2021-08-03 DIAGNOSIS — J22 Unspecified acute lower respiratory infection: Secondary | ICD-10-CM

## 2021-08-03 MED ORDER — LEVOFLOXACIN 500 MG PO TABS: 500.0000 mg | ORAL_TABLET | Freq: Every day | ORAL | 0 refills | Status: AC

## 2021-08-03 MED ORDER — HYDROCODONE BIT-HOMATROP MBR 5-1.5 MG/5ML PO SOLN: 5.0000 mL | Freq: Three times a day (TID) | ORAL | 0 refills | Status: AC | PRN

## 2021-08-03 NOTE — Progress Notes (Signed)
? ?  Subjective:  ? ? Patient ID: Cameron Mccarthy, male    DOB: [birth year], 51 y.o.   MRN: [id number] ? ?HPI 51 year old Male not seen here in several years with history of type 2 diabetes mellitus. ? 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  He is married. ? ?Is seeing Dr. Roda Shutters for chronic left shoulder pain in 2019.  Diagnosed with left scapulothoracic bursitis or trigger point.  Trigger point injection was performed.  He was referred to physical therapy. ? ?Tick bite of right leg seen in the emergency department 2018.  Had bull's-eye on right leg approximately 6 cm in diameter.  Was felt to have cellulitis and was treated with doxycycline. ? ?History of right otitis media seen in the emergency department in April 2019. ? ?Had laceration of left hand while changing a tire seen in the emergency department at [facility] July 2019.  Simple suturing required with 5 sutures. ? ?Had open reduction internal fixation of trimalleolar ankle fracture by Dr. Lajoyce Corners on September 30, 2019.  Accident occurred Sep 28, 2019. ? ?Has seen Dr. Lucianne Muss for type 2 diabetes mellitus but not recently. ? ?History of smoking but has quit.  Used to smoke cigarettes.  Consumes alcohol socially. ? ?No known drug allergies. ? ?Patient reports of cough for 5 weeks. ? ?Review of Systems no fever or shaking chills ? ?   ?Objective:  ? Physical Exam ?Vital signs reviewed.  Blood pressure 130/82 left arm ;pulse 92 regular; pulse oximetry 96% temperature 97.8 degrees ?Skin warm and dry.  No cervical adenopathy.  Chest: Fine inspiratory rales both lower lobes.  Cardiac exam regular rate and rhythm.  TMs clear.  Pharynx is clear. ? ? ?   ?Assessment & Plan:  ?Protracted lower respiratory infection ? ?History of type 2 diabetes mellitus ? ?Plan: Patient will have chest x-ray today.  Treated with Levaquin 500 mg daily for 7 days and Hycodan cough syrup 1 teaspoon every 8 hours as needed for cough.  Rest and  drink fluids and call if not improving in 48 to 72 hours or sooner if worse. ? ?

## 2021-08-03 NOTE — Progress Notes (Signed)
Called to let patient know that his chest Xray should no PNA ?

## 2021-08-20 ENCOUNTER — Encounter: Payer: Self-pay | Admitting: Internal Medicine

## 2021-08-20 NOTE — Patient Instructions (Signed)
We are sorry you are not feeling well.  It was a pleasure to see you today.  Take Levaquin 500 mg daily for 7 days and Hycodan cough syrup 1 teaspoon every 8 hours as needed for cough.  Rest and drink fluids and call if not improving in 48 to 72 hours or sooner if worse.  Please have chest x-ray. ?

## 2022-03-08 ENCOUNTER — Ambulatory Visit (INDEPENDENT_AMBULATORY_CARE_PROVIDER_SITE_OTHER): Payer: Self-pay | Admitting: Orthopedic Surgery

## 2022-03-08 DIAGNOSIS — S86012A Strain of left Achilles tendon, initial encounter: Secondary | ICD-10-CM

## 2022-03-20 ENCOUNTER — Encounter: Payer: Self-pay | Admitting: Orthopedic Surgery

## 2022-03-20 NOTE — Progress Notes (Signed)
Office Visit Note   Patient: Cameron Mccarthy           Date of Birth: 08/15/1970           MRN: [id number] Visit Date: 03/08/2022              Requested by: Margaree Mackintosh, MD [address] [place],  [place] [address] PCP: Margaree Mackintosh, MD  Chief Complaint  Patient presents with   Left Foot - Pain      HPI: Patient is a 51 year old gentleman who is 10 days out from acute Achilles tendon injury.  Patient complains of pain and swelling patient has been using an Ace wrap to help decrease the swelling.  Assessment & Plan: Visit Diagnoses:  1. Rupture of left Achilles tendon, initial encounter     Plan: Patient wishes to try nonoperative treatment.  We will place him in a fracture boot with 2 heel lifts protected weightbearing reevaluate in the office in 4 weeks.  Follow-Up Instructions: Return in about 4 weeks (around 04/05/2022).   Ortho Exam  Patient is alert, oriented, no adenopathy, well-dressed, normal affect, normal respiratory effort. Examination patient has a palpable defect mid substance of the Achilles.  Compression of the calf does not reproduce plantarflexion of the foot.  There is no redness no cellulitis no open wounds.  With the patient's foot plantarflexed the gap in the Achilles rupture resolves.  Imaging: No results found. No images are attached to the encounter.  Labs: Lab Results  Component Value Date   HGBA1C 10.7 (A) 11/11/2017     Lab Results  Component Value Date   ALBUMIN 4.0 11/11/2017   ALBUMIN 3.8 01/26/2015    No results found for: "MG" No results found for: "VD25OH"  No results found for: "PREALBUMIN"    Latest Ref Rng & Units 09/30/2019   11:22 AM 01/26/2015    3:47 AM  CBC EXTENDED  WBC 4.0 - 10.5 K/uL 7.8  8.2   RBC 4.22 - 5.81 MIL/uL 5.68  5.45   Hemoglobin 13.0 - 17.0 g/dL 01.7  49.4   HCT 49.6 - 52.0 % 50.9  48.9   Platelets 150 - 400 K/uL 270  236   NEUT# 1.7 - 7.7 K/uL  4.6   Lymph# 0.7 - 4.0 K/uL  2.5       There is no height or weight on file to calculate BMI.  Orders:  No orders of the defined types were placed in this encounter.  No orders of the defined types were placed in this encounter.    Procedures: No procedures performed  Clinical Data: No additional findings.  ROS:  All other systems negative, except as noted in the HPI. Review of Systems  Objective: Vital Signs: There were no vitals taken for this visit.  Specialty Comments:  No specialty comments available.  PMFS History: Patient Active Problem List   Diagnosis Date Noted   Trimalleolar fracture of ankle, closed, right, initial encounter    Ankle syndesmosis disruption, right, initial encounter    Diabetic peripheral neuropathy associated with type 2 diabetes mellitus (HCC) 11/11/2017   Hypercholesterolemia 11/11/2017   Past Medical History:  Diagnosis Date   Diabetes mellitus without complication (HCC)     Not taking DM meds- "I could not work and take DM meds."    Family History  Problem Relation Age of Onset   Gout Father    Kidney Stones Father    Diabetes Neg Hx  Past Surgical History:  Procedure Laterality Date   ORIF ANKLE FRACTURE Right 09/30/2019   Procedure: OPEN REDUCTION INTERNAL FIXATION (ORIF) RIGHT TRIMALLEOLAR ANKLE FRACTURE AND SYNDESMOSIS REPAIR;  Surgeon: Nadara Mustard, MD;  Location: [facility] OR;  Service: Orthopedics;  Laterality: Right;   Social History   Occupational History   Not on file  Tobacco Use   Smoking status: Never   Smokeless tobacco: Never   Tobacco comments:    "as a teenager on the back of the bus"  Vaping Use   Vaping Use: Never used  Substance and Sexual Activity   Alcohol use: Yes   Drug use: No   Sexual activity: Yes

## 2022-04-05 ENCOUNTER — Ambulatory Visit (INDEPENDENT_AMBULATORY_CARE_PROVIDER_SITE_OTHER): Payer: Self-pay | Admitting: Orthopedic Surgery

## 2022-04-05 ENCOUNTER — Encounter: Payer: Self-pay | Admitting: Orthopedic Surgery

## 2022-04-05 ENCOUNTER — Telehealth: Payer: Self-pay

## 2022-04-05 DIAGNOSIS — S86012A Strain of left Achilles tendon, initial encounter: Secondary | ICD-10-CM

## 2022-04-05 NOTE — Telephone Encounter (Signed)
I tried to call the pt and the mail box was full will hold and try again.

## 2022-04-05 NOTE — Telephone Encounter (Signed)
Unfortunately has to be returned within 2 weeks.

## 2022-04-05 NOTE — Telephone Encounter (Signed)
This pt was in the office today and he brought in his fx boot and states that when he received it a month ago he never wore it and wanted to see if we could credit his account. I advised the pt that I was not sure that was an option being that he did have the item for 4 weeks but that I would ask. He did leave the boot here in the office. Please let me know if there is anything that we can do.

## 2022-04-05 NOTE — Progress Notes (Signed)
Office Visit Note   Patient: Cameron Mccarthy           Date of Birth: 02-04-1971           MRN: [id number] Visit Date: 04/05/2022              Requested by: Margaree Mackintosh, MD [address] [place],  [place] [address] PCP: Margaree Mackintosh, MD  Chief Complaint  Patient presents with   Left Achilles Tendon - Injury, Follow-up    Achilles tendon rupture      HPI: Patient is a 51 year old gentleman who presents in follow-up for left Achilles tendon rupture that he wished to treat nonoperatively with a fracture boot and heel lifts.  Patient presents today he is not wearing the heel lifts he is wearing sneakers.  Assessment & Plan: Visit Diagnoses:  1. Rupture of left Achilles tendon, initial encounter     Plan: Patient was given a new 916 since heel lift to wear in his sneakers.  Patient will increase his activities as tolerated follow-up as needed.  Follow-Up Instructions: Return if symptoms worsen or fail to improve.   Ortho Exam  Patient is alert, oriented, no adenopathy, well-dressed, normal affect, normal respiratory effort. Examination the Achilles is healing well there is no palpable defect.  Compression of the calf reproduces plantarflexion of the foot.  Patient has a palpable pulse.  Imaging: No results found. No images are attached to the encounter.  Labs: Lab Results  Component Value Date   HGBA1C 10.7 (A) 11/11/2017     Lab Results  Component Value Date   ALBUMIN 4.0 11/11/2017   ALBUMIN 3.8 01/26/2015    No results found for: "MG" No results found for: "VD25OH"  No results found for: "PREALBUMIN"    Latest Ref Rng & Units 09/30/2019   11:22 AM 01/26/2015    3:47 AM  CBC EXTENDED  WBC 4.0 - 10.5 K/uL 7.8  8.2   RBC 4.22 - 5.81 MIL/uL 5.68  5.45   Hemoglobin 13.0 - 17.0 g/dL 57.9  03.8   HCT 33.3 - 52.0 % 50.9  48.9   Platelets 150 - 400 K/uL 270  236   NEUT# 1.7 - 7.7 K/uL  4.6   Lymph# 0.7 - 4.0 K/uL  2.5      There is no height or  weight on file to calculate BMI.  Orders:  No orders of the defined types were placed in this encounter.  No orders of the defined types were placed in this encounter.    Procedures: No procedures performed  Clinical Data: No additional findings.  ROS:  All other systems negative, except as noted in the HPI. Review of Systems  Objective: Vital Signs: There were no vitals taken for this visit.  Specialty Comments:  No specialty comments available.  PMFS History: Patient Active Problem List   Diagnosis Date Noted   Trimalleolar fracture of ankle, closed, right, initial encounter    Ankle syndesmosis disruption, right, initial encounter    Diabetic peripheral neuropathy associated with type 2 diabetes mellitus (HCC) 11/11/2017   Hypercholesterolemia 11/11/2017   Past Medical History:  Diagnosis Date   Diabetes mellitus without complication (HCC)     Not taking DM meds- "I could not work and take DM meds."    Family History  Problem Relation Age of Onset   Gout Father    Kidney Stones Father    Diabetes Neg Hx     Past Surgical History:  Procedure Laterality Date   ORIF ANKLE FRACTURE Right 09/30/2019   Procedure: OPEN REDUCTION INTERNAL FIXATION (ORIF) RIGHT TRIMALLEOLAR ANKLE FRACTURE AND SYNDESMOSIS REPAIR;  Surgeon: Nadara Mustard, MD;  Location: [facility] OR;  Service: Orthopedics;  Laterality: Right;   Social History   Occupational History   Not on file  Tobacco Use   Smoking status: Never   Smokeless tobacco: Never   Tobacco comments:    "as a teenager on the back of the bus"  Vaping Use   Vaping Use: Never used  Substance and Sexual Activity   Alcohol use: Yes   Drug use: No   Sexual activity: Yes

## 2022-05-15 IMAGING — CR DG CHEST 2V
2 series · 2 of 2 positions shown · non-contrast
Comparison: Chest radiographs 01/26/2015

CLINICAL DATA: Bilateral rales.  Cough for 5 weeks.

EXAM:
CHEST - 2 VIEW

[w chest pa]
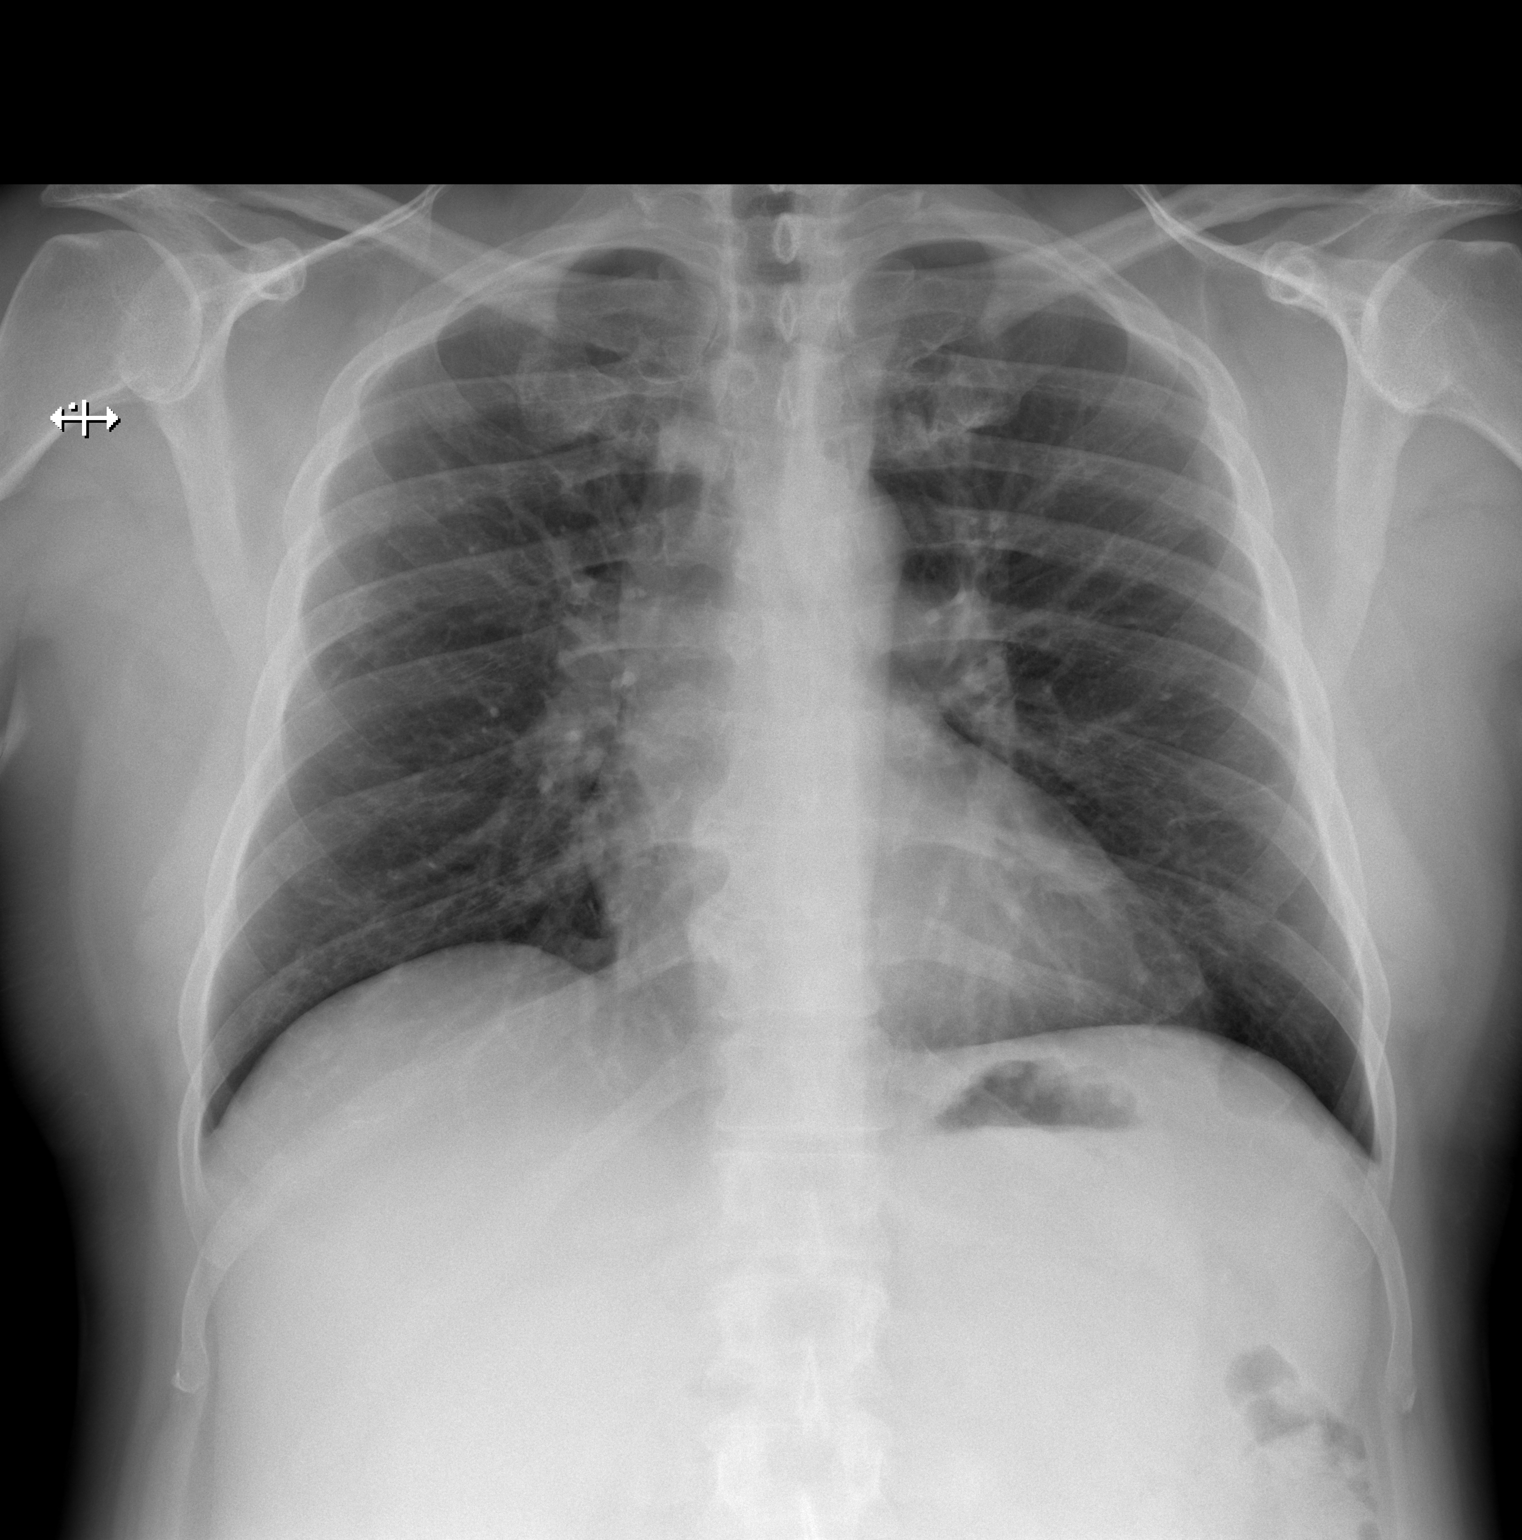

[w chest lat]
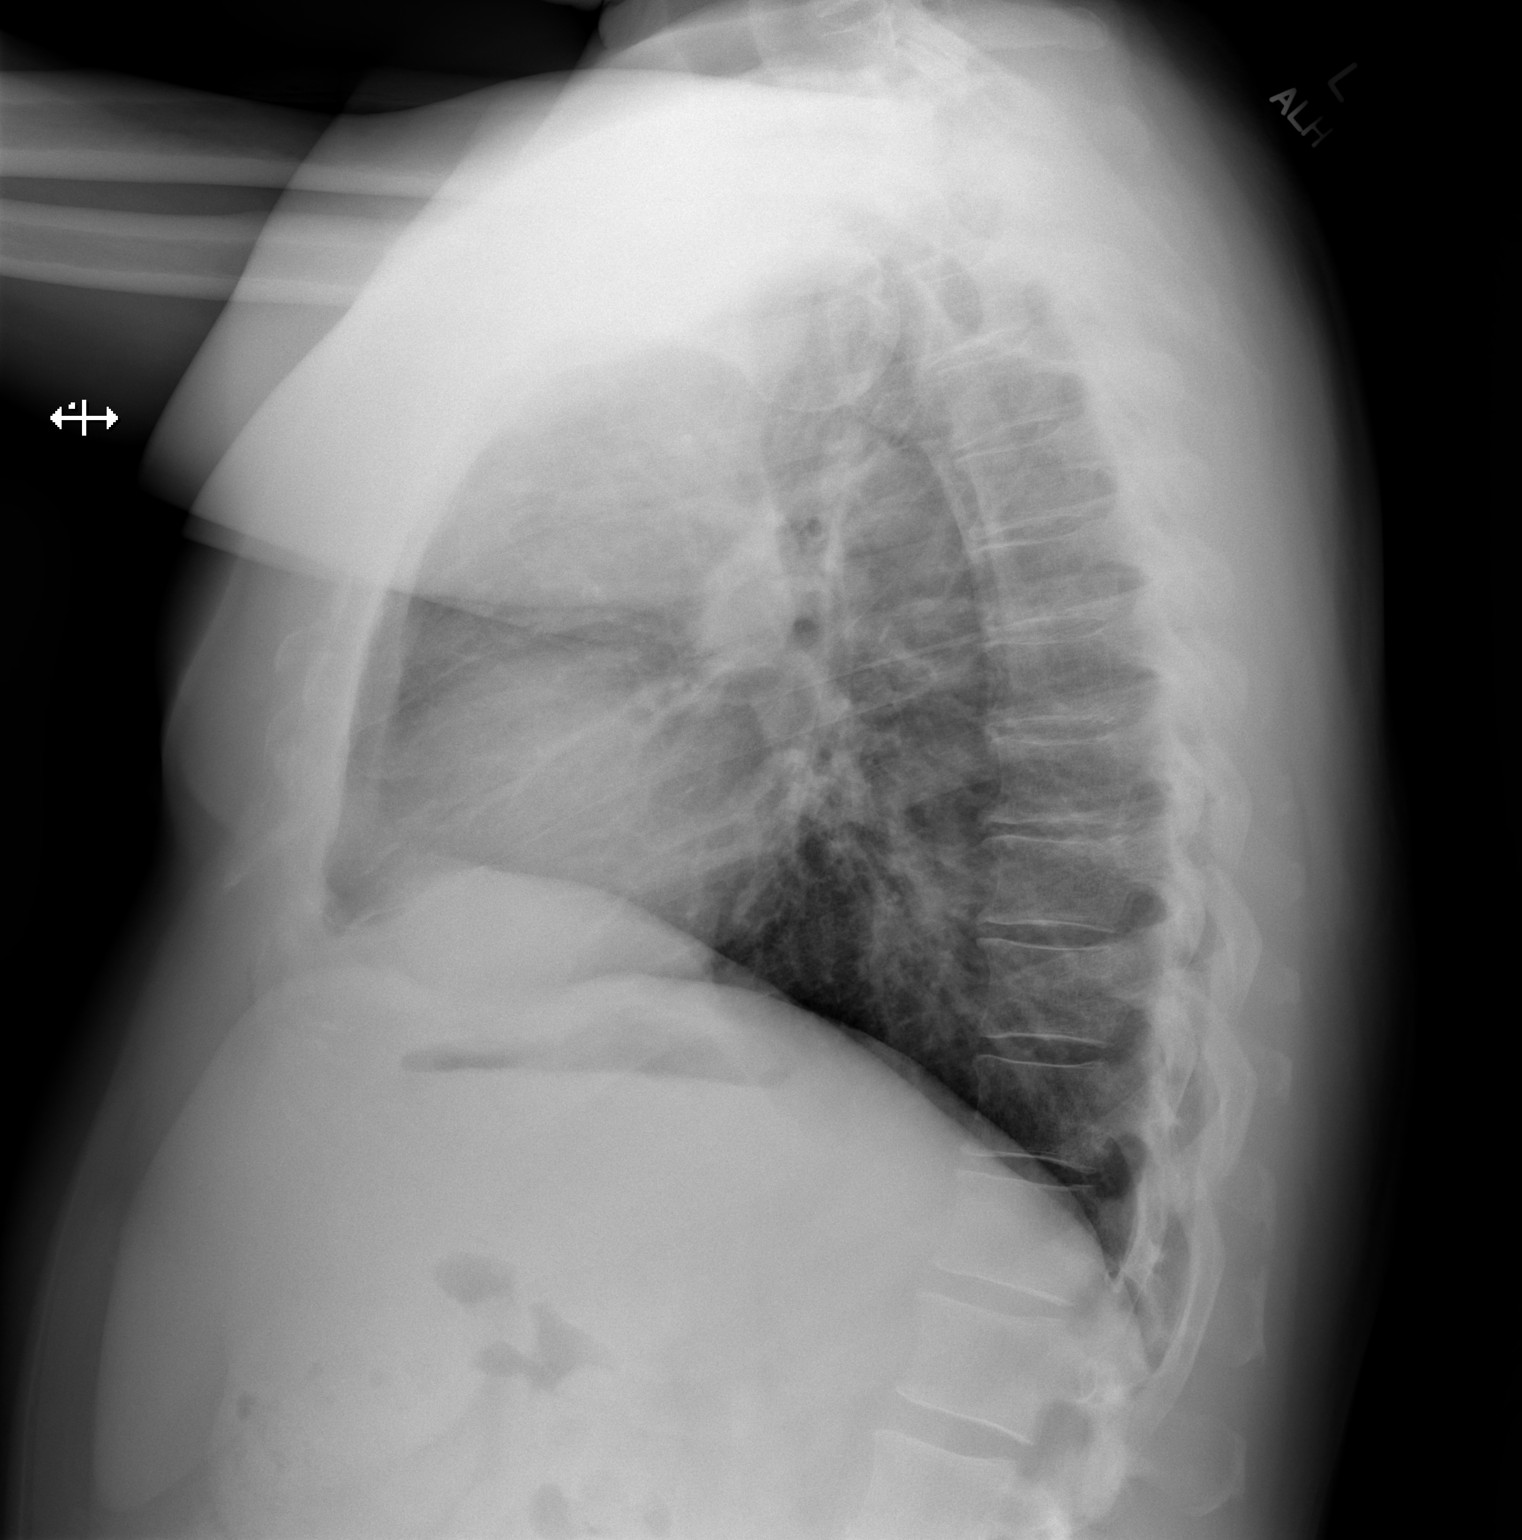

[2 of 2 positions shown; findings below may reference images not displayed]

FINDINGS: The cardiomediastinal silhouette is within normal limits. The lungs
are better inflated than on the prior study. No airspace
consolidation, edema, pleural effusion, or pneumothorax is
identified. No acute osseous abnormality is seen.
IMPRESSION: No active cardiopulmonary disease.

## 2022-12-22 ENCOUNTER — Encounter (HOSPITAL_COMMUNITY): Payer: Self-pay

## 2022-12-22 ENCOUNTER — Emergency Department (HOSPITAL_COMMUNITY): Payer: Medicaid Other

## 2022-12-22 ENCOUNTER — Emergency Department (HOSPITAL_COMMUNITY)
Admission: EM | Admit: 2022-12-22 | Discharge: 2022-12-22 | Disposition: A | Discharge status: Home / Self Care | Payer: Medicaid Other | Attending: Emergency Medicine | Admitting: Emergency Medicine

## 2022-12-22 ENCOUNTER — Other Ambulatory Visit: Payer: Self-pay

## 2022-12-22 DIAGNOSIS — E1165 Type 2 diabetes mellitus with hyperglycemia: Secondary | ICD-10-CM | POA: Insufficient documentation

## 2022-12-22 DIAGNOSIS — H5712 Ocular pain, left eye: Secondary | ICD-10-CM | POA: Diagnosis present

## 2022-12-22 DIAGNOSIS — Z7984 Long term (current) use of oral hypoglycemic drugs: Secondary | ICD-10-CM | POA: Diagnosis not present

## 2022-12-22 DIAGNOSIS — R739 Hyperglycemia, unspecified: Secondary | ICD-10-CM

## 2022-12-22 DIAGNOSIS — J0100 Acute maxillary sinusitis, unspecified: Secondary | ICD-10-CM | POA: Diagnosis not present

## 2022-12-22 DIAGNOSIS — H40052 Ocular hypertension, left eye: Secondary | ICD-10-CM | POA: Insufficient documentation

## 2022-12-22 LAB — CBG MONITORING, ED
Glucose-Capillary: 300 mg/dL — ABNORMAL HIGH (ref 70–99)
Glucose-Capillary: 285 mg/dL — ABNORMAL HIGH (ref 70–99)

## 2022-12-22 LAB — BASIC METABOLIC PANEL
Anion gap: 12 (ref 5–15)
BUN: 13 mg/dL (ref 6–20)
CO2: 21 mmol/L — ABNORMAL LOW (ref 22–32)
Calcium: 8.8 mg/dL — ABNORMAL LOW (ref 8.9–10.3)
Chloride: 98 mmol/L (ref 98–111)
Creatinine, Ser: 0.99 mg/dL (ref 0.61–1.24)
GFR, Estimated: >60 mL/min (ref >60)
Glucose, Bld: 402 mg/dL — ABNORMAL HIGH (ref 70–99)
Potassium: 4 mmol/L (ref 3.5–5.1)
Sodium: 131 mmol/L — ABNORMAL LOW (ref 135–145)

## 2022-12-22 LAB — CBC
HCT: 43.4 % (ref 39.0–52.0)
Hemoglobin: 15.3 g/dL (ref 13.0–17.0)
MCH: 30.2 pg (ref 26.0–34.0)
MCHC: 35.3 g/dL (ref 30.0–36.0)
MCV: 85.6 fL (ref 80.0–100.0)
Platelets: 297 10*3/uL (ref 150–400)
RBC: 5.07 MIL/uL (ref 4.22–5.81)
RDW: 12.4 % (ref 11.5–15.5)
WBC: 7.3 10*3/uL (ref 4.0–10.5)
nRBC: 0 % (ref 0.0–0.2)

## 2022-12-22 MED ORDER — FLUORESCEIN SODIUM 1 MG OP STRP
1.0000 | ORAL_STRIP | Freq: Once | OPHTHALMIC | Status: AC
  Administered 2022-12-22: 1 via OPHTHALMIC
  Filled 2022-12-22: qty 1

## 2022-12-22 MED ORDER — DORZOLAMIDE HCL-TIMOLOL MAL 2-0.5 % OP SOLN: 1.0000 [drp] | Freq: Two times a day (BID) | OPHTHALMIC | 0 refills | Status: AC

## 2022-12-22 MED ORDER — AMOXICILLIN-POT CLAVULANATE 875-125 MG PO TABS: 1.0000 | ORAL_TABLET | Freq: Two times a day (BID) | ORAL | 0 refills | Status: AC

## 2022-12-22 MED ORDER — TETRACAINE HCL 0.5 % OP SOLN
2.0000 [drp] | Freq: Once | OPHTHALMIC | Status: AC
  Administered 2022-12-22: 2 [drp] via OPHTHALMIC
  Filled 2022-12-22: qty 4

## 2022-12-22 MED ORDER — SODIUM CHLORIDE 0.9 % IV BOLUS
1000.0000 mL | Freq: Once | INTRAVENOUS | Status: AC
  Administered 2022-12-22: 1000 mL via INTRAVENOUS

## 2022-12-22 MED ORDER — INSULIN ASPART 100 UNIT/ML IJ SOLN
8.0000 [IU] | Freq: Once | INTRAMUSCULAR | Status: AC
  Administered 2022-12-22: 8 [IU] via INTRAVENOUS

## 2022-12-22 MED ORDER — IOHEXOL 350 MG/ML SOLN
75.0000 mL | Freq: Once | INTRAVENOUS | Status: AC | PRN
  Administered 2022-12-22: 75 mL via INTRAVENOUS

## 2022-12-22 MED ORDER — ACETAZOLAMIDE 250 MG PO TABS: 500.0000 mg | ORAL_TABLET | Freq: Two times a day (BID) | ORAL | 0 refills | Status: AC

## 2022-12-22 NOTE — ED Triage Notes (Signed)
Per Transfer report from [facility] concerns expressed for eye pain, rednewss vision change without discharge, c/o iritis orbital cellulitis and glaucoma. 20/50, 20/70

## 2022-12-22 NOTE — ED Notes (Signed)
Ortho tech arrived delivered back brace. Reports device has been fitted appropriately and ready for placement when Pt request/discharged.

## 2022-12-22 NOTE — ED Provider Notes (Signed)
[place] EMERGENCY DEPARTMENT AT [facility] Provider Note   CSN: [id number] Arrival date & time: 12/22/22  1408     History  Chief Complaint  Patient presents with   Eye Problem    Cameron Mccarthy is a 52 y.o. male past medical history significant for diabetes, hyperlipidemia who presents with concern for left eye pain, redness, and vision change.  Patient wears glasses at baseline, reports that he is significant decreased vision in the left eye for the last 4 to 5 days.  He denies any known injury.  Notably with a dental infection and recent extraction on the left.  Does feel some pain in the surrounding face.  He endorses some light sensitivity.  He denies any body numbness, tingling, facial droop, confusion.  No previous history of glaucoma.   Eye Problem      Home Medications Prior to Admission medications   Medication Sig Start Date End Date Taking? Authorizing Provider  acetaZOLAMIDE (DIAMOX) 250 MG tablet Take 2 tablets (500 mg total) by mouth 2 (two) times daily. 12/22/22  Yes Indria Bishara H, PA-C  amoxicillin-clavulanate (AUGMENTIN) 875-125 MG tablet Take 1 tablet by mouth every 12 (twelve) hours. 12/22/22  Yes Aunesti Pellegrino H, PA-C  dorzolamide-timolol (COSOPT) 2-0.5 % ophthalmic solution Place 1 drop into the left eye 2 (two) times daily. 12/22/22  Yes Daris Aristizabal H, PA-C  HYDROcodone bit-homatropine (HYCODAN) 5-1.5 MG/5ML syrup Take 5 mLs by mouth every 8 (eight) hours as needed for cough. 08/03/21   Margaree Mackintosh, MD  gabapentin (NEURONTIN) 100 MG capsule TAKE 2 CAPSULES (200 MG TOTAL) BY MOUTH AT BEDTIME. Patient not taking: Reported on 09/28/2019 08/27/18 09/28/19  Reather Littler, MD  glimepiride (AMARYL) 4 MG tablet TAKE 1 TABLET (4 MG TOTAL) BY MOUTH DAILY BEFORE SUPPER *FOLLOW UP FOR REFILLS* Patient not taking: No sig reported 05/02/18 09/28/19  Reather Littler, MD      Allergies    Patient has no known allergies.    Review of Systems    Review of Systems  All other systems reviewed and are negative.   Physical Exam Updated Vital Signs BP (!) 170/90 (BP Location: Right Arm)   Pulse 80   Temp (!) 97.5 F (36.4 C)   Resp 18   SpO2 100%  Physical Exam Vitals and nursing note reviewed.  Constitutional:      General: He is not in acute distress.    Appearance: Normal appearance.  HENT:     Head: Normocephalic and atraumatic.  Eyes:     General:        Right eye: No discharge.        Left eye: No discharge.     Comments: Extraocular movements intact bilaterally, pupil is reactive to light and accommodation on the right, with fixed mid dilated pupil on the left.  Conjunctival redness noted.  No abrasion noted on fluorescein exam.  Slit-lamp exam grossly normal.  Intraocular pressure 42 mmHg on the left.  Cardiovascular:     Rate and Rhythm: Normal rate and regular rhythm.     Heart sounds: No murmur heard.    No friction rub. No gallop.  Pulmonary:     Effort: Pulmonary effort is normal.     Breath sounds: Normal breath sounds.  Abdominal:     General: Bowel sounds are normal.     Palpations: Abdomen is soft.  Skin:    General: Skin is warm and dry.     Capillary Refill: Capillary  refill takes less than 2 seconds.     Comments: Mild surrounding skin redness, no gross proptosis noted on the left.  No purulent drainage from eye or surrounding fascial tissue.  Neurological:     Mental Status: He is alert and oriented to person, place, and time.  Psychiatric:        Mood and Affect: Mood normal.        Behavior: Behavior normal.     ED Results / Procedures / Treatments   Labs (all labs ordered are listed, but only abnormal results are displayed) Labs Reviewed  BASIC METABOLIC PANEL - Abnormal; Notable for the following components:      Result Value   Sodium 131 (*)    CO2 21 (*)    Glucose, Bld 402 (*)    Calcium 8.8 (*)    All other components within normal limits  CBG MONITORING, ED - Abnormal;  Notable for the following components:   Glucose-Capillary 300 (*)    All other components within normal limits  CBG MONITORING, ED - Abnormal; Notable for the following components:   Glucose-Capillary 285 (*)    All other components within normal limits  CBC    EKG None  Radiology CT Orbits W Contrast  Result Date: 12/22/2022 CLINICAL DATA:  Provided history: Periorbital cellulitis. Additional history obtained from electronic MEDICAL RECORD NUMBERLeft eye pain, redness, blurred vision. EXAM: CT ORBITS WITH CONTRAST TECHNIQUE: Multidetector CT images was performed according to the standard protocol following intravenous contrast administration. RADIATION DOSE REDUCTION: This exam was performed according to the departmental dose-optimization program which includes automated exposure control, adjustment of the mA and/or kV according to patient size and/or use of iterative reconstruction technique. CONTRAST:  75mL OMNIPAQUE IOHEXOL 350 MG/ML SOLN COMPARISON:  None. FINDINGS: Orbits: No definite periorbital soft tissue swelling or inflammatory stranding is appreciated. No findings to suggest post-septal orbital cellulitis. The globes are normal in size and contour. The extraocular muscles, optic nerve sheath complexes and lacrimal glands are symmetric and unremarkable. Visible paranasal sinuses: Moderate-to-severe mucosal thickening within the left maxillary sinus. Minimal mucosal thickening within the right ethmoid and bilateral frontal sinuses. Soft tissues: No definite periorbital soft tissue swelling or inflammatory stranding is appreciated. Osseous: No fracture or aggressive osseous lesion. Limited intracranial: No evidence of an acute intracranial abnormality within the field of view. IMPRESSION: 1. No definite periorbital soft tissue swelling/inflammatory changes appreciable by CT. Correlate with the physical exam findings. No CT evidence of post-septal orbital cellulitis. 2. Moderate-to-severe mucosal  thickening within the left maxillary sinus. Electronically Signed   By: Jackey Loge D.O.   On: 12/22/2022 17:23    Procedures Procedures    Medications Ordered in ED Medications  tetracaine (PONTOCAINE) 0.5 % ophthalmic solution 2 drop (2 drops Left Eye Given 12/22/22 1723)  fluorescein ophthalmic strip 1 strip (1 strip Left Eye Given 12/22/22 1724)  sodium chloride 0.9 % bolus 1,000 mL (0 mLs Intravenous Stopped 12/22/22 1830)  insulin aspart (novoLOG) injection 8 Units (8 Units Intravenous Given 12/22/22 1720)  iohexol (OMNIPAQUE) 350 MG/ML injection 75 mL (75 mLs Intravenous Contrast Given 12/22/22 1648)    ED Course/ Medical Decision Making/ A&P Clinical Course as of 12/22/22 1916  Sat Dec 22, 2022  1553 Unremarkable slit-lamp exam, intraocular pressure on the left 42 mmHg.  No corneal abrasion noted on fluorescein exam. [CP]  1617 Glucose(!): 402 Hyperglycemia without anion gap, will administer fluids and insulin while pending Optho consult [CP]  1640 See ophthalm -- acetazolamide  500mg  bid for pressure. Drozolomide / timolol. [employer] on Monday.  [CP]    Clinical Course User Index [CP] Olene Floss, PA-C                                 Medical Decision Making Amount and/or Complexity of Data Reviewed Labs: ordered. Decision-making details documented in ED Course. Radiology: ordered.  Risk Prescription drug management.   This patient is a 52 y.o. male  who presents to the ED for concern of visual loss, eye pain, redness.   Differential diagnoses prior to evaluation: The emergent differential diagnosis includes, but is not limited to,  glaucoma, acute vs subacute, periorbital vs orbital cellulitis, less concern but also concerned corneal abrasion or ulceration, hemorrhage of retinal artery or vein, vs other . This is not an exhaustive differential.   Past Medical History / Co-morbidities / Social History: Diabetes, obesity  Physical Exam: Physical  exam performed. The pertinent findings include: Extraocular movements intact bilaterally, pupil is reactive to light and accommodation on the right, with fixed mid dilated pupil on the left.  Conjunctival redness noted.  No abrasion noted on fluorescein exam.  Slit-lamp exam grossly normal.  Intraocular pressure 42 mmHg on the left.  Patient is hypertensive in the emergency department, blood pressure 170/90.  Mild surrounding skin redness, no gross proptosis noted on the left.  No purulent drainage from eye or surrounding fascial tissue.   Lab Tests/Imaging studies: I personally interpreted labs/imaging and the pertinent results include: BMP with pseudohyponatremia, sodium 131 in context of glucose 402.  We will administer fluids as well as insulin.  CBC unremarkable, notably with no leukocytosis.  I independently interpreted CT orbit with contrast which shows some left-sided sinus disease, but no evidence of orbital cellulitis or other abnormality.  I agree with the radiologist interpretation.   Medications: I ordered medication including fluids, insulin in the emergency department to treat hyperglycemia.  Discharged on dissolve my atenolol, acetazolamide, and Augmentin to cover for sinus infection as well as increased intraocular pressure.  I have reviewed the patients home medicines and have made adjustments as needed.  Consults: Spoke with Dr. Nada Libman with ophthalmology who recommends close follow-up on Monday and recommended the medications furosemide, timolol, acetazolamide   Disposition: After consideration of the diagnostic results and the patients response to treatment, I feel that patient stable for discharge with plan to follow-up closely with ophthalmology as above.   emergency department workup does not suggest an emergent condition requiring admission or immediate intervention beyond what has been performed at this time. The plan is: as above. The patient is safe for discharge and  has been instructed to return immediately for worsening symptoms, change in symptoms or any other concerns.  Final Clinical Impression(s) / ED Diagnoses Final diagnoses:  Elevated IOP, left  Hyperglycemia  Acute maxillary sinusitis, recurrence not specified    Rx / DC Orders ED Discharge Orders          Ordered    dorzolamide-timolol (COSOPT) 2-0.5 % ophthalmic solution  2 times daily        12/22/22 1828    acetaZOLAMIDE (DIAMOX) 250 MG tablet  2 times daily        12/22/22 1828    amoxicillin-clavulanate (AUGMENTIN) 875-125 MG tablet  Every 12 hours        12/22/22 1828  [place] 12/22/22 1916    Pricilla Loveless, MD 12/26/22 1225

## 2022-12-22 NOTE — Discharge Instructions (Addendum)
As we discussed I prescribed 3 medications.  There is an oral medication acetazolamide, you should take 500 mg twice daily, until directed otherwise by the eye doctor.  Please use the eyedrops I have prescribed twice daily, as well as take the entire course of antibiotics that are prescribed as you do have some left-sided sinus infection and I want to ensure that this may not be contributing to the issue.  Additionally your blood sugar was quite elevated in the emergency department, I recommend speaking to your primary care doctor about a diabetes medication which you could tolerate.  Not treating your diabetes can lead to multiple health problems in the future, including risk for further eye problems, heart disease, loss of limb, slow wound healing, and much more.

## 2022-12-22 NOTE — ED Triage Notes (Signed)
Pt reports left eye pain, redness and blurred vision for the past week. Pt sent here by [facility].

## 2022-12-24 ENCOUNTER — Telehealth: Payer: Self-pay

## 2022-12-24 NOTE — Transitions of Care (Post Inpatient/ED Visit) (Signed)
   12/24/2022  Name: Cameron Mccarthy MRN: [id number] DOB: 12-Nov-1970  Today's TOC FU Call Status: Today's TOC FU Call Status:: Successful TOC FU Call Completed TOC FU Call Complete Date: 12/24/22 Patient's Name and Date of Birth confirmed.  Transition Care Management Follow-up Telephone Call Date of Discharge: 12/22/22 Discharge Facility: [facility]) Type of Discharge: Emergency Department Reason for ED Visit: Other: (Elevated IOP, left) How have you been since you were released from the hospital?: Better Any questions or concerns?: No  Items Reviewed: Did you receive and understand the discharge instructions provided?: Yes Medications obtained,verified, and reconciled?: Yes (Medications Reviewed) Any new allergies since your discharge?: No Dietary orders reviewed?: Yes Do you have support at home?: Yes  Medications Reviewed Today: Medications Reviewed Today     Reviewed by Merleen Nicely, LPN (Licensed Practical Nurse) on 12/24/22 at 1354  Med List Status: <None>   Medication Order Taking? Sig Documenting Provider Last Dose Status Informant  acetaZOLAMIDE (DIAMOX) 250 MG tablet [id number] Yes Take 2 tablets (500 mg total) by mouth 2 (two) times daily. Prosperi, Christian H, PA-C Taking Active   amoxicillin-clavulanate (AUGMENTIN) 875-125 MG tablet [id number] Yes Take 1 tablet by mouth every 12 (twelve) hours. Prosperi, Christian H, PA-C Taking Active   dorzolamide-timolol (COSOPT) 2-0.5 % ophthalmic solution [id number] Yes Place 1 drop into the left eye 2 (two) times daily. Olene Floss, [place] Taking Active    Patient not taking:   Discontinued 09/28/19 1815  Patient not taking:   Discontinued 09/28/19 1815 HYDROcodone bit-homatropine (HYCODAN) 5-1.5 MG/5ML syrup [id number] No Take 5 mLs by mouth every 8 (eight) hours as needed for cough.  Patient not taking: Reported on 12/24/2022   Margaree Mackintosh, MD Not Taking Active             Home Care and  Equipment/Supplies: Were Home Health Services Ordered?: NA Any new equipment or medical supplies ordered?: NA  Functional Questionnaire: Do you need assistance with bathing/showering or dressing?: No Do you need assistance with meal preparation?: No Do you need assistance with eating?: No Do you have difficulty maintaining continence: No Do you need assistance with getting out of bed/getting out of a chair/moving?: No Do you have difficulty managing or taking your medications?: No  Follow up appointments reviewed: PCP Follow-up appointment confirmed?: Yes Follow-up Provider: [facility] Follow-up appointment confirmed?: No Reason Specialist Follow-Up Not Confirmed: Patient has Specialist Provider Number and will Call for Appointment Do you need transportation to your follow-up appointment?: No Do you understand care options if your condition(s) worsen?: Yes-patient verbalized understanding    SIGNATURE  Woodfin Ganja LPN [facility] Nurse Health Advisor Direct Dial [phone]

## 2023-01-01 ENCOUNTER — Encounter: Payer: Self-pay | Admitting: Internal Medicine

## 2023-01-01 ENCOUNTER — Ambulatory Visit: Payer: Self-pay | Admitting: Internal Medicine

## 2023-01-01 VITALS — BP 140/78 | HR 82 | Ht 71.0 in | Wt 205.0 lb

## 2023-01-01 DIAGNOSIS — E1165 Type 2 diabetes mellitus with hyperglycemia: Secondary | ICD-10-CM

## 2023-01-01 DIAGNOSIS — H409 Unspecified glaucoma: Secondary | ICD-10-CM

## 2023-01-01 LAB — POCT URINALYSIS DIP (CLINITEK)
Bilirubin, UA: NEGATIVE
Glucose, UA: 250 mg/dL — AB
Ketones, POC UA: NEGATIVE mg/dL
Leukocytes, UA: NEGATIVE
Nitrite, UA: NEGATIVE
POC PROTEIN,UA: 100 — AB
Spec Grav, UA: 1.015 (ref 1.010–1.025)
Urobilinogen, UA: 0.2 U/dL
pH, UA: 6 (ref 5.0–8.0)

## 2023-01-01 NOTE — Patient Instructions (Signed)
Needs urgent referral to Ophthalmology. Needs to go back to Endocrinology and should resume Jardiance now. He has some on hand.Labs drawn and pending. Continue Diamox and Cosopt prescribed in ED.

## 2023-01-01 NOTE — Progress Notes (Addendum)
Patient Care Team: Margaree Mackintosh, MD as PCP - General (Internal Medicine)  Visit Date: 01/01/23  Subjective:    Patient ID: Cameron Mccarthy , Male   DOB: Sep 23, 1970, 52 y.o.    MRN: [id number]   52 y.o. Male presents today for a hospital/ED follow-up. He was seen in the ED on 12/22/22 for left elevated intraocular pressure. Given tetracaine left eye, fluorescein left eye, IV fluids, novoLOG. CT orbit with contrast showed left-sided sinus disease but no evidence of orbital cellulitis or other abnormality. Discharged with Cosopt, acetazolamide, Augmentin. Finished Augmentin. Stopped Cosopt drops due to burning sensation and worsening of vision in left eye. Symptoms initially started 12/16/22 when he awoke with severe pain in his left eye. His left eye vision is currently blurry. Vision in right eye is not blurry. Blood pressure is elevated today at 140/78.  Not seen here since April 2023.Was treated  here then for acute respiratory infection. In November 2023 saw Dr. Lajoyce Corners for ruptured Achilles tendon left lower extremity.Had comminuted fracture right arm October 2022.Had trimalleolar fracture of ankle Sept 2021.  This required open reduction internal fixation by Dr. Levonne Spiller in June 2021.  Accident occurred Sep 28, 2019.  History of left hand laceration changing a tire requiring emergency department visit at [facility] July 2019 and required 5 sutures.  Has seen Dr. Roda Shutters, orthopedist for chronic left shoulder pain in 2019.  Was diagnosed with left scapulothoracic bursitis or trigger point.  Trigger point injection was performed and he was referred to physical therapy.  IN ED, intraocular pressure was 42 on the left. CT of orbit showed no cellultitis. Had thickening in left maxillary sinus. Slit lamp exam there reported as grossly normal.BP was 170/90. He was afebrile with excellent O2 sat. He was hyponatremic with BP 131 and glucose 402 likely contributing to his hyponatremia. BUN and Creatinine were  normal in 2021 and were normal in ED on August 24.Hgb AIC not ordered in ED. Was 10.7% in July 2019 and is pending today.  Seen for Diabetes care and management only twice by Dr. Lucianne Muss in 2019 and placed on Jardiance.  Currently not taking any medication for diabetes mellitus  Social history: Married.  He is a core Psychologist, clinical in halter horses.  Former smoker.  Consumes alcohol socially.  No known drug allergies.  He is scheduled to go to [place]  to a horse show circuit this coming weekend and will be staying several days.  Past Medical History:  Diagnosis Date   Diabetes mellitus without complication (HCC)     Not taking DM meds- "I could not work and take DM meds."     Family History  Problem Relation Age of Onset   Gout Father    Kidney Stones Father    Diabetes Neg Hx          Review of Systems  Constitutional:  Negative for fever and malaise/fatigue.  HENT:  Negative for congestion.   Eyes:  Positive for blurred vision (Left).  Respiratory:  Negative for cough and shortness of breath.   Cardiovascular:  Negative for chest pain, palpitations and leg swelling.  Gastrointestinal:  Negative for vomiting.  Musculoskeletal:  Negative for back pain.  Skin:  Negative for rash.  Neurological:  Negative for loss of consciousness and headaches.        Objective:   Vitals: BP (!) 140/78   Pulse 82   Ht 5\' 11"  (1.803 m)   Wt 205 lb (  93 kg)   SpO2 98%   BMI 28.59 kg/m  Left pupil is 3mm in diameter and not reactive to light or accomodation. Alert and oriebted x 3 . Moved all 4 extremities, No gait disturbance. Muscle strength is normal.      Results:   Studies obtained and personally reviewed by me:   Labs:       Component Value Date/Time   NA 131 (L) 12/22/2022 1530   K 4.0 12/22/2022 1530   CL 98 12/22/2022 1530   CO2 21 (L) 12/22/2022 1530   GLUCOSE 402 (H) 12/22/2022 1530   BUN 13 12/22/2022 1530   CREATININE 0.99 12/22/2022 1530    CALCIUM 8.8 (L) 12/22/2022 1530   PROT 6.7 11/11/2017 1451   ALBUMIN 4.0 11/11/2017 1451   AST 15 11/11/2017 1451   ALT 25 11/11/2017 1451   ALKPHOS 50 11/11/2017 1451   BILITOT 1.3 (H) 11/11/2017 1451   GFRNONAA >60 12/22/2022 1530   GFRAA >60 09/30/2019 1122     Lab Results  Component Value Date   WBC 7.3 12/22/2022   HGB 15.3 12/22/2022   HCT 43.4 12/22/2022   MCV 85.6 12/22/2022   PLT 297 12/22/2022    Lab Results  Component Value Date   HGBA1C 10.7 (A) 11/11/2017      Assessment & Plan:   Elevated Left Eye pressure: 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 Ordered CMET, A1c, microalbumin/creatinine urine ratio. Urinalysis showed glucose at 250, small blood, protein at 100. He is to continue medication prescribed in ED. We will get back in touch with lab results. Was placed on Diamox in ED and cosopt 2-0.5% ophthalmic drops  Uncontrolled Diabetes mellitus. Needs to resume Jardiance and has some on hand at home.Needs urgent Ophthalmology evaluation. We will try to assist with this. His coverage may be limited to certain providers.  I,Alexander Ruley,acting as a Neurosurgeon for Margaree Mackintosh, MD.,have documented all relevant documentation on the behalf of Margaree Mackintosh, MD,as directed by  Margaree Mackintosh, MD while in the presence of Margaree Mackintosh, MD.   I, Margaree Mackintosh, MD, have reviewed all documentation for this visit. The documentation on 01/01/23 for the exam, diagnosis, procedures, and orders are all accurate and complete.

## 2023-01-02 LAB — COMPLETE METABOLIC PANEL WITH GFR
AG Ratio: 1.4 (calc) (ref 1.0–2.5)
ALT: 13 U/L (ref 9–46)
AST: 13 U/L (ref 10–35)
Albumin: 4.2 g/dL (ref 3.6–5.1)
Alkaline phosphatase (APISO): 51 U/L (ref 35–144)
BUN: 16 mg/dL (ref 7–25)
CO2: 20 mmol/L (ref 20–32)
Calcium: 8.9 mg/dL (ref 8.6–10.3)
Chloride: 105 mmol/L (ref 98–110)
Creat: 0.9 mg/dL (ref 0.70–1.30)
Globulin: 2.9 g/dL (ref 1.9–3.7)
Glucose, Bld: 290 mg/dL — ABNORMAL HIGH (ref 65–99)
Potassium: 4.2 mmol/L (ref 3.5–5.3)
Sodium: 136 mmol/L (ref 135–146)
Total Bilirubin: 1.6 mg/dL — ABNORMAL HIGH (ref 0.2–1.2)
Total Protein: 7.1 g/dL (ref 6.1–8.1)
eGFR: 103 mL/min/{1.73_m2} (ref >=60)

## 2023-01-02 LAB — MICROALBUMIN / CREATININE URINE RATIO
Creatinine, Urine: 119 mg/dL (ref 20–320)
Microalb Creat Ratio: 621 mg/g{creat} — ABNORMAL HIGH (ref <30)
Microalb, Ur: 73.9 mg/dL

## 2023-01-02 LAB — HEMOGLOBIN A1C
Hgb A1c MFr Bld: 11.5 %{Hb} — ABNORMAL HIGH (ref <5.7)
Mean Plasma Glucose: 283 mg/dL
eAG (mmol/L): 15.7 mmol/L

## 2023-01-03 NOTE — Addendum Note (Signed)
Addended by: Margaree Mackintosh on: 01/03/2023 05:27 PM   Modules accepted: Level of Service

## 2023-01-04 NOTE — Progress Notes (Signed)
Called and spoke with Cameron Mccarthy about sending referral to [facility] in [place] and also about he needs to make an appointment here and he said he would call back when he gets back in town.

## 2023-01-28 ENCOUNTER — Telehealth: Payer: Self-pay | Admitting: Internal Medicine

## 2023-01-28 MED ORDER — EMPAGLIFLOZIN 25 MG PO TABS: 25.0000 mg | ORAL_TABLET | Freq: Every day | ORAL | 0 refills | Status: DC

## 2023-01-28 NOTE — Telephone Encounter (Signed)
Sherri Levenhagen [phone]  Dede called to say [facility] is out of below medication and I also let her know he was also suppose to schedule a 6 week follow up. She went ahead and schedule it for Friday and she told me he had eye surgery last Monday.  Jardaince 25 mg.  [facility] at  [facility]

## 2023-02-01 ENCOUNTER — Ambulatory Visit (INDEPENDENT_AMBULATORY_CARE_PROVIDER_SITE_OTHER): Payer: Self-pay | Admitting: Internal Medicine

## 2023-02-01 ENCOUNTER — Encounter: Payer: Self-pay | Admitting: Internal Medicine

## 2023-02-01 VITALS — BP 130/78 | HR 74 | Ht 71.0 in | Wt 202.4 lb

## 2023-02-01 DIAGNOSIS — E119 Type 2 diabetes mellitus without complications: Secondary | ICD-10-CM

## 2023-02-01 NOTE — Patient Instructions (Signed)
Have written Rx for Jardiance. Continue Follow up with Ophthalmology. Suggest Flu vaccine and Tdap vaccines. Has appt with [place] [facility] soon.

## 2023-02-01 NOTE — Progress Notes (Addendum)
Patient Care Team: Margaree Mackintosh, MD as PCP - General (Internal Medicine)  Visit Date: 02/01/23  Subjective:    Patient ID: Cameron Mccarthy , Male   DOB: March 13, 1971, 52 y.o.    MRN: [id number]   52 y.o. Male presents today for an office visit. History of Type 2 diabetes mellitus treated with Jardiance 25 mg daily. Reports [place] Pepper is very expensive now that he has lost his insurance.He has tried [employer].He says he is not sure what happened to his coverage Tried getting a GoodRX card but even with that Jardiance cost was projected by pharmacist to be about $400. Jardiance worked well when under the care of Dr. Lucianne Muss.Dr. Lucianne Muss has retired recently and he will be seen soon at [facility]. Patient took his last dose of Jardiance on 01/28/23. Reports a blood glucose measurement of 206 during recent medical appointment. This is the lowest he has seen in years. Reports he is eating healthy.  He had a posterior vitrectomy left eye with Dr. Sherryll Burger on 01/21/23. Reports intraocular pressure has improved. His vision in the left eye is blurry. He has been having headaches recently. 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 He is using several types of eye drops. Has follow up soon with Ophthalmology.   Denies urinary frequency.  Past Medical History:  Diagnosis Date   Diabetes mellitus without complication (HCC)     Not taking DM meds- "I could not work and take DM meds."     Family History  Problem Relation Age of Onset   Gout Father    Kidney Stones Father    Diabetes Neg Hx     Social Hx: married. Patient is a Insurance account manager. One son.     Review of Systems  Constitutional:  Negative for fever and malaise/fatigue.  HENT:  Negative for congestion.   Eyes:  Positive for blurred vision (Left eye).  Respiratory:  Negative for cough and shortness of breath.   Cardiovascular:  Negative for chest pain, palpitations and  leg swelling.  Gastrointestinal:  Negative for vomiting.  Musculoskeletal:  Negative for back pain.  Skin:  Negative for rash.  Neurological:  Positive for headaches. Negative for loss of consciousness.        Objective:   Vitals: BP 130/78 (BP Location: Left Arm, Patient Position: Sitting, Cuff Size: Normal)   Pulse 74   Ht 5\' 11"  (1.803 m)   Wt 202 lb 6.4 oz (91.8 kg)   SpO2 97%   BMI 28.23 kg/m    Physical Exam Constitutional:      General: He is not in acute distress.    Appearance: Normal appearance. He is not ill-appearing.  HENT:     Head: Normocephalic and atraumatic.  Cardiovascular:     Rate and Rhythm: Normal rate and regular rhythm.     Pulses: Normal pulses.     Heart sounds: Normal heart sounds. No murmur heard.    No friction rub. No gallop.  Pulmonary:     Effort: Pulmonary effort is normal. No respiratory distress.     Breath sounds: Normal breath sounds. No wheezing or rales.  Skin:    General: Skin is warm and dry.  Neurological:     Mental Status: He is alert and oriented to person, place, and time. Mental status is at baseline.  Psychiatric:        Mood and Affect: Mood normal.        Behavior:  Behavior normal.        Thought Content: Thought content normal.        Judgment: Judgment normal.       Results:   Studies obtained and personally reviewed by me:   Labs:       Component Value Date/Time   NA 136 01/01/2023 1233   K 4.2 01/01/2023 1233   CL 105 01/01/2023 1233   CO2 20 01/01/2023 1233   GLUCOSE 290 (H) 01/01/2023 1233   BUN 16 01/01/2023 1233   CREATININE 0.90 01/01/2023 1233   CALCIUM 8.9 01/01/2023 1233   PROT 7.1 01/01/2023 1233   ALBUMIN 4.0 11/11/2017 1451   AST 13 01/01/2023 1233   ALT 13 01/01/2023 1233   ALKPHOS 50 11/11/2017 1451   BILITOT 1.6 (H) 01/01/2023 1233   GFRNONAA >60 12/22/2022 1530   GFRAA >60 09/30/2019 1122     Lab Results  Component Value Date   WBC 7.3 12/22/2022   HGB 15.3 12/22/2022    HCT 43.4 12/22/2022   MCV 85.6 12/22/2022   PLT 297 12/22/2022    No results found for: "CHOL", "HDL", "LDLCALC", "LDLDIRECT", "TRIG", "CHOLHDL"  Lab Results  Component Value Date   HGBA1C 11.5 (H) 01/01/2023      Assessment & Plan:   Type 2 diabetes mellitus: currently not on any medication. Given a written prescription for Jardiance. Visit  with Endocrinology scheduled for 02/18/23.  BMI 28  Health maintenance: vaccines discussed. Pt to consider. Due for Colonoscopy.  Follow up here in November after seeing Endocrinologist. Continue close follow up with Ophthalmology.    I,Alexander Ruley,acting as a Neurosurgeon for Margaree Mackintosh, MD.,have documented all relevant documentation on the behalf of Margaree Mackintosh, MD,as directed by  Margaree Mackintosh, MD while in the presence of Margaree Mackintosh, MD.   I, Margaree Mackintosh, MD, have reviewed all documentation for this visit. The documentation on 02/01/23 for the exam, diagnosis, procedures, and orders are all accurate and complete.

## 2023-02-07 ENCOUNTER — Other Ambulatory Visit: Payer: Self-pay

## 2023-02-07 DIAGNOSIS — E1165 Type 2 diabetes mellitus with hyperglycemia: Secondary | ICD-10-CM

## 2023-02-14 ENCOUNTER — Other Ambulatory Visit (INDEPENDENT_AMBULATORY_CARE_PROVIDER_SITE_OTHER): Payer: Medicaid Other

## 2023-02-14 DIAGNOSIS — E1165 Type 2 diabetes mellitus with hyperglycemia: Secondary | ICD-10-CM | POA: Diagnosis not present

## 2023-02-15 LAB — HEMOGLOBIN A1C: Hgb A1c MFr Bld: 9.9 % — ABNORMAL HIGH (ref 4.6–6.5)

## 2023-02-15 LAB — COMPREHENSIVE METABOLIC PANEL
ALT: 14 U/L (ref 0–53)
AST: 11 U/L (ref 0–37)
Albumin: 4.3 g/dL (ref 3.5–5.2)
Alkaline Phosphatase: 42 U/L (ref 39–117)
BUN: 25 mg/dL — ABNORMAL HIGH (ref 6–23)
CO2: 26 meq/L (ref 19–32)
Calcium: 9.8 mg/dL (ref 8.4–10.5)
Chloride: 99 meq/L (ref 96–112)
Creatinine, Ser: 0.95 mg/dL (ref 0.40–1.50)
GFR: 91.89 mL/min (ref >60.00)
Glucose, Bld: 255 mg/dL — ABNORMAL HIGH (ref 70–99)
Potassium: 4.4 meq/L (ref 3.5–5.1)
Sodium: 138 meq/L (ref 135–145)
Total Bilirubin: 1.2 mg/dL (ref 0.2–1.2)
Total Protein: 7.3 g/dL (ref 6.0–8.3)

## 2023-02-15 LAB — MICROALBUMIN / CREATININE URINE RATIO
Creatinine,U: 46.2 mg/dL
Microalb Creat Ratio: 39.7 mg/g — ABNORMAL HIGH (ref 0.0–30.0)
Microalb, Ur: 18.3 mg/dL — ABNORMAL HIGH (ref 0.0–1.9)

## 2023-02-15 LAB — LIPID PANEL
Cholesterol: 305 mg/dL — ABNORMAL HIGH (ref 0–200)
HDL: 41.6 mg/dL (ref >39.00)
LDL Cholesterol: 188 mg/dL — ABNORMAL HIGH (ref 0–99)
NonHDL: 263.5
Total CHOL/HDL Ratio: 7
Triglycerides: 376 mg/dL — ABNORMAL HIGH (ref 0.0–149.0)
VLDL: 75.2 mg/dL — ABNORMAL HIGH (ref 0.0–40.0)

## 2023-02-18 ENCOUNTER — Telehealth (INDEPENDENT_AMBULATORY_CARE_PROVIDER_SITE_OTHER): Payer: Medicaid Other | Admitting: "Endocrinology

## 2023-02-18 ENCOUNTER — Encounter: Payer: Self-pay | Admitting: "Endocrinology

## 2023-02-18 ENCOUNTER — Ambulatory Visit: Payer: BLUE CROSS/BLUE SHIELD | Admitting: "Endocrinology

## 2023-02-18 ENCOUNTER — Telehealth: Payer: Self-pay

## 2023-02-18 VITALS — Ht 71.0 in

## 2023-02-18 DIAGNOSIS — E1165 Type 2 diabetes mellitus with hyperglycemia: Secondary | ICD-10-CM | POA: Diagnosis not present

## 2023-02-18 DIAGNOSIS — E782 Mixed hyperlipidemia: Secondary | ICD-10-CM | POA: Diagnosis not present

## 2023-02-18 DIAGNOSIS — Z7984 Long term (current) use of oral hypoglycemic drugs: Secondary | ICD-10-CM | POA: Diagnosis not present

## 2023-02-18 MED ORDER — METFORMIN HCL ER 500 MG PO TB24: 500.0000 mg | ORAL_TABLET | Freq: Two times a day (BID) | ORAL | 3 refills | Status: DC

## 2023-02-18 NOTE — Patient Instructions (Signed)
We will start you on an extended release version of metformin, which should help with the upset stomach. Start with one 500 mg capsule taken every evening with dinner. Stay on this for 3-5 days, then increase to 1 tablet with breakfast and one with dinner. If you do not have any upset stomach, gradually increase to the goal dose of 2 capsules with breakfast and 2 capsules with dinner. If you do have upset stomach, decrease it back to the previous dose that you tolerated.  Week 1: one tablet after dinner Week 2: one tablet after breakfast and one after dinner  Week 3: one tablet after breakfast and two after dinner  Week 4 and onwards: two tablets after breakfast and two after dinner

## 2023-02-18 NOTE — Progress Notes (Unsigned)
The patient reports they are currently: Cameron Mccarthy. I spent 14-15 minutes on the video with the patient on the date of service. I spent an additional 15 minutes on pre- and post-visit activities on the date of service.   The patient was physically located in [place] or a state in which I am permitted to provide care. The patient and/or parent/guardian understood that s/he may incur co-pays and cost sharing, and agreed to the telemedicine visit. The visit was reasonable and appropriate under the circumstances given the patient's presentation at the time.  The patient and/or parent/guardian has been advised of the potential risks and limitations of this mode of treatment (including, but not limited to, the absence of in-person examination) and has agreed to be treated using telemedicine. The patient's/patient's family's questions regarding telemedicine have been answered.   The patient and/or parent/guardian has also been advised to contact their provider's office for worsening conditions, and seek emergency medical treatment and/or call 911 if the patient deems either necessary.'     Outpatient Endocrinology Note Cameron Inniswold, MD  02/18/23   SYER VERHOEF 11-Jun-1970 [id number]  Referring Provider: Margaree Mackintosh, MD Primary Care Provider: Margaree Mackintosh, MD Reason for consultation: Subjective   Assessment & Plan  There are no diagnoses linked to this encounter.  Diabetes Type II complicated by neuropathy, retinopathy, microalbuminuria  Lab Results  Component Value Date   GFR 91.89 02/14/2023   Hba1c goal less than 7, current Hba1c is  Lab Results  Component Value Date   HGBA1C 9.9 (H) 02/14/2023   Will recommend the following: Failed metformin due to GI intolerance Discussed need for insulin, patient is not interested in injections Requests for medications that he can afford  We will start you on an extended release version of metformin, which should help with the upset  stomach. Start with one 500 mg capsule taken every evening with dinner. Stay on this for 3-5 days, then increase to 1 tablet with breakfast and one with dinner. If you do not have any upset stomach, gradually increase to the goal dose of 2 capsules with breakfast and 2 capsules with dinner. If you do have upset stomach, decrease it back to the previous dose that you tolerated.  Week 1: one tablet after dinner Week 2: one tablet after breakfast and one after dinner  Week 3: one tablet after breakfast and two after dinner  Week 4 and onwards: two tablets after breakfast and two after dinner  No known contraindications/side effects to any of above medications Once DM controlled, will need to start low dose ACE - and statin  -Last LD and Tg are as follows: Lab Results  Component Value Date   LDLCALC 188 (H) 02/14/2023    Lab Results  Component Value Date   TRIG 376.0 (H) 02/14/2023   -not on statin, discussed need for it -Follow low fat diet and exercise   -Blood pressure goal <140/90 - Microalbumin/creatinine goal is < 30 -Last MA/Cr is as follows: Lab Results  Component Value Date   MICROALBUR 18.3 (H) 02/14/2023   -not on ACE/ARB  -diet changes including salt restriction -limit eating outside -counseled BP targets per standards of diabetes care -uncontrolled blood pressure can lead to retinopathy, nephropathy and cardiovascular and atherosclerotic heart disease  Reviewed and counseled on: -A1C target -Blood sugar targets -Complications of uncontrolled diabetes  -Checking blood sugar before meals and bedtime and bring log next visit -All medications with mechanism of action and side effects -Hypoglycemia  management: rule of 15's, Glucagon Emergency Kit and medical alert ID -low-carb low-fat plate-method diet -At least 20 minutes of physical activity per day -Annual dilated retinal eye exam and foot exam -compliance and follow up needs -follow up as scheduled or earlier if  problem gets worse  Call if blood sugar is less than 70 or consistently above 250    Take a 15 gm snack of carbohydrate at bedtime before you go to sleep if your blood sugar is less than 100.    If you are going to fast after midnight for a test or procedure, ask your physician for instructions on how to reduce/decrease your insulin dose.    Call if blood sugar is less than 70 or consistently above 250  -Treating a low sugar by rule of 15  (15 gms of sugar every 15 min until sugar is more than 70) If you feel your sugar is low, test your sugar to be sure If your sugar is low (less than 70), then take 15 grams of a fast acting Carbohydrate (3-4 glucose tablets or glucose gel or 4 ounces of juice or regular soda) Recheck your sugar 15 min after treating low to make sure it is more than 70 If sugar is still less than 70, treat again with 15 grams of carbohydrate          Don't drive the hour of hypoglycemia  If unconscious/unable to eat or drink by mouth, use glucagon injection or nasal spray baqsimi and call 911. Can repeat again in 15 min if still unconscious.  Return in about 4 weeks (around 03/18/2023).   I have reviewed current medications, nurse's notes, allergies, vital signs, past medical and surgical history, family medical history, and social history for this encounter. Counseled patient on symptoms, examination findings, lab findings, imaging results, treatment decisions and monitoring and prognosis. The patient understood the recommendations and agrees with the treatment plan. All questions regarding treatment plan were fully answered.  Cameron Kenefic, MD  02/18/23    History of Present Illness Cameron Mccarthy is a 52 y.o. year old male who presents for evaluation of Type II diabetes mellitus.  Cameron Mccarthy was first diagnosed around.   Diabetes education -  Home diabetes regimen: Jardiance 25 mg qd  COMPLICATIONS -  MI/Stroke +  retinopathy +  neuropathy -   nephropathy  SYMPTOMS REVIEWED - Polyuria - Weight loss + Blurred vision  BLOOD SUGAR DATA Range 194-212, checks fasting intermittently   Physical Exam  Ht 5\' 11"  (1.803 m)   BMI 28.23 kg/m    Constitutional: well developed, well nourished Head: normocephalic, atraumatic Eyes: sclera anicteric, no redness Neck: supple Lungs: normal respiratory effort Neurology: alert and oriented Skin: dry, no appreciable rashes Musculoskeletal: no appreciable defects Psychiatric: normal mood and affect Diabetic Foot Exam - Simple   No data filed      Current Medications Patient's Medications  New Prescriptions   No medications on file  Previous Medications   ACETAZOLAMIDE (DIAMOX) 250 MG TABLET    Take 2 tablets (500 mg total) by mouth 2 (two) times daily.   AMOXICILLIN-CLAVULANATE (AUGMENTIN) 875-125 MG TABLET    Take 1 tablet by mouth every 12 (twelve) hours.   BRIMONIDINE (ALPHAGAN) 0.2 % OPHTHALMIC SOLUTION    Place 1 drop into the left eye 4 (four) times daily as needed.   DORZOLAMIDE-TIMOLOL (COSOPT) 2-0.5 % OPHTHALMIC SOLUTION    Place 1 drop into the left eye 2 (two) times daily.  EMPAGLIFLOZIN (JARDIANCE) 25 MG TABS TABLET    Take 1 tablet (25 mg total) by mouth daily before breakfast.   HYDROCODONE BIT-HOMATROPINE (HYCODAN) 5-1.5 MG/5ML SYRUP    Take 5 mLs by mouth every 8 (eight) hours as needed for cough.  Modified Medications   No medications on file  Discontinued Medications   No medications on file    Allergies No Known Allergies  Past Medical History Past Medical History:  Diagnosis Date   Diabetes mellitus without complication (HCC)     Not taking DM meds- "I could not work and take DM meds."    Past Surgical History Past Surgical History:  Procedure Laterality Date   ORIF ANKLE FRACTURE Right 09/30/2019   Procedure: OPEN REDUCTION INTERNAL FIXATION (ORIF) RIGHT TRIMALLEOLAR ANKLE FRACTURE AND SYNDESMOSIS REPAIR;  Surgeon: Nadara Mustard, MD;  Location: [facility]  OR;  Service: Orthopedics;  Laterality: Right;    Family History family history includes Gout in his father; Kidney Stones in his father.  Social History Social History   Socioeconomic History   Marital status: Married    Spouse name: Not on file   Number of children: Not on file   Years of education: Not on file   Highest education level: Not on file  Occupational History   Not on file  Tobacco Use   Smoking status: Never   Smokeless tobacco: Never   Tobacco comments:    "as a teenager on the back of the bus"  Vaping Use   Vaping status: Never Used  Substance and Sexual Activity   Alcohol use: Yes   Drug use: No   Sexual activity: Yes  Other Topics Concern   Not on file  Social History Narrative   Not on file   Social Determinants of Health   Financial Resource Strain: Not on file  Food Insecurity: Not on file  Transportation Needs: Not on file  Physical Activity: Not on file  Stress: Not on file  Social Connections: Not on file  Intimate Partner Violence: Not on file    Lab Results  Component Value Date   HGBA1C 9.9 (H) 02/14/2023   HGBA1C 11.5 (H) 01/01/2023   HGBA1C 10.7 (A) 11/11/2017   Lab Results  Component Value Date   CHOL 305 (H) 02/14/2023   Lab Results  Component Value Date   HDL 41.60 02/14/2023   Lab Results  Component Value Date   LDLCALC 188 (H) 02/14/2023   Lab Results  Component Value Date   TRIG 376.0 (H) 02/14/2023   Lab Results  Component Value Date   CHOLHDL 7 02/14/2023   Lab Results  Component Value Date   CREATININE 0.95 02/14/2023   Lab Results  Component Value Date   GFR 91.89 02/14/2023   Lab Results  Component Value Date   MICROALBUR 18.3 (H) 02/14/2023      Component Value Date/Time   NA 138 02/14/2023 1413   K 4.4 02/14/2023 1413   CL 99 02/14/2023 1413   CO2 26 02/14/2023 1413   GLUCOSE 255 (H) 02/14/2023 1413   BUN 25 (H) 02/14/2023 1413   CREATININE 0.95 02/14/2023 1413   CREATININE 0.90  01/01/2023 1233   CALCIUM 9.8 02/14/2023 1413   PROT 7.3 02/14/2023 1413   ALBUMIN 4.3 02/14/2023 1413   AST 11 02/14/2023 1413   ALT 14 02/14/2023 1413   ALKPHOS 42 02/14/2023 1413   BILITOT 1.2 02/14/2023 1413   GFRNONAA >60 12/22/2022 1530   GFRAA >60 09/30/2019 1122  Latest Ref Rng & Units 02/14/2023    2:13 PM 01/01/2023   12:33 PM 12/22/2022    3:30 PM  BMP  Glucose 70 - 99 mg/dL 161  096  045   BUN 6 - 23 mg/dL 25  16  13    Creatinine 0.40 - 1.50 mg/dL 4.09  8.11  9.14   BUN/Creat Ratio 6 - 22 (calc)  SEE NOTE:    Sodium 135 - 145 mEq/L 138  136  131   Potassium 3.5 - 5.1 mEq/L 4.4  4.2  4.0   Chloride 96 - 112 mEq/L 99  105  98   CO2 19 - 32 mEq/L 26  20  21    Calcium 8.4 - 10.5 mg/dL 9.8  8.9  8.8        Component Value Date/Time   WBC 7.3 12/22/2022 1530   RBC 5.07 12/22/2022 1530   HGB 15.3 12/22/2022 1530   HCT 43.4 12/22/2022 1530   PLT 297 12/22/2022 1530   MCV 85.6 12/22/2022 1530   MCH 30.2 12/22/2022 1530   MCHC 35.3 12/22/2022 1530   RDW 12.4 12/22/2022 1530   LYMPHSABS 2.5 01/26/2015 0347   MONOABS 0.8 01/26/2015 0347   EOSABS 0.3 01/26/2015 0347   BASOSABS 0.1 01/26/2015 0347     Parts of this note may have been dictated using voice recognition software. There may be variances in spelling and vocabulary which are unintentional. Not all errors are proofread. Please notify the Thereasa Parkin if any discrepancies are noted or if the meaning of any statement is not clear.

## 2023-02-18 NOTE — Telephone Encounter (Signed)
Medication Samples have been provided to the patient.  Drug name: Jardiance       Strength: 25mg         Qty: 4 boxes  LOT: [id number]  Exp.Date: June 2026  Dosing instructions: take 1 tablet by mouth daily  The patient has been instructed regarding the correct time, dose, and frequency of taking this medication, including desired effects and most common side effects.   Leota Sauers 2:58 PM 02/18/2023

## 2023-02-19 ENCOUNTER — Ambulatory Visit: Payer: BLUE CROSS/BLUE SHIELD | Admitting: "Endocrinology

## 2023-03-05 DIAGNOSIS — H4312 Vitreous hemorrhage, left eye: Secondary | ICD-10-CM | POA: Diagnosis not present

## 2023-03-05 DIAGNOSIS — H4053X2 Glaucoma secondary to other eye disorders, bilateral, moderate stage: Secondary | ICD-10-CM | POA: Diagnosis not present

## 2023-03-05 DIAGNOSIS — E113513 Type 2 diabetes mellitus with proliferative diabetic retinopathy with macular edema, bilateral: Secondary | ICD-10-CM | POA: Diagnosis not present

## 2023-03-05 DIAGNOSIS — H3581 Retinal edema: Secondary | ICD-10-CM | POA: Diagnosis not present

## 2023-03-05 DIAGNOSIS — H2513 Age-related nuclear cataract, bilateral: Secondary | ICD-10-CM | POA: Diagnosis not present

## 2023-03-19 DIAGNOSIS — H4312 Vitreous hemorrhage, left eye: Secondary | ICD-10-CM | POA: Diagnosis not present

## 2023-03-19 DIAGNOSIS — H3581 Retinal edema: Secondary | ICD-10-CM | POA: Diagnosis not present

## 2023-03-19 DIAGNOSIS — H2513 Age-related nuclear cataract, bilateral: Secondary | ICD-10-CM | POA: Diagnosis not present

## 2023-03-19 DIAGNOSIS — H4053X2 Glaucoma secondary to other eye disorders, bilateral, moderate stage: Secondary | ICD-10-CM | POA: Diagnosis not present

## 2023-03-19 DIAGNOSIS — E113513 Type 2 diabetes mellitus with proliferative diabetic retinopathy with macular edema, bilateral: Secondary | ICD-10-CM | POA: Diagnosis not present

## 2023-04-02 ENCOUNTER — Other Ambulatory Visit: Payer: Self-pay

## 2023-04-02 ENCOUNTER — Telehealth: Payer: Self-pay | Admitting: "Endocrinology

## 2023-04-02 NOTE — Telephone Encounter (Signed)
Patient out of Jardiance and need a PA for Jardiance to be covered under Medicaid. Please call patient/ if any samples are available please let patient.

## 2023-04-02 NOTE — Telephone Encounter (Signed)
Medication Samples have been provided to the patient.  Drug name: Jardiance       Strength: 25 mg        Qty: 4 boxes   Dosing instructions: 1 daily  The patient has been instructed regarding the correct time, dose, and frequency of taking this medication, including desired effects and most common side effects.   Anaid Haney A Su Hilt 4:20 PM 04/02/2023   And Prior Auth was sent to the PA team

## 2023-04-02 NOTE — Telephone Encounter (Signed)
Patient out of Jardiance and need a PA for Jardiance to be covered under Medicaid

## 2023-04-03 ENCOUNTER — Telehealth: Payer: Self-pay

## 2023-04-03 ENCOUNTER — Other Ambulatory Visit (HOSPITAL_COMMUNITY): Payer: Self-pay

## 2023-04-03 NOTE — Telephone Encounter (Signed)
Pharmacy Patient Advocate Encounter   Received notification from Pt Calls Messages that prior authorization for Jardiance is required/requested.   Insurance verification completed.   The patient is insured through [facility] .   Per test claim: PA required; PA started via CoverMyMeds. KEY [id number] . Waiting for clinical questions to populate.

## 2023-04-03 NOTE — Telephone Encounter (Signed)
Patients wife picked up 4 boxes of Jardiance 25mg (04/03/2023)

## 2023-04-03 NOTE — Telephone Encounter (Signed)
Clinical info has been submitted

## 2023-04-05 NOTE — Telephone Encounter (Signed)
Michael is aware

## 2023-04-05 NOTE — Telephone Encounter (Signed)
Pharmacy Patient Advocate Encounter  Received notification from [facility] that Prior Authorization for jardiance has been APPROVED from 04/03/23 to 04/02/24   PA #/Case ID/Reference #: [id number]

## 2023-04-09 DIAGNOSIS — H2513 Age-related nuclear cataract, bilateral: Secondary | ICD-10-CM | POA: Diagnosis not present

## 2023-04-09 DIAGNOSIS — H3581 Retinal edema: Secondary | ICD-10-CM | POA: Diagnosis not present

## 2023-04-09 DIAGNOSIS — H4053X2 Glaucoma secondary to other eye disorders, bilateral, moderate stage: Secondary | ICD-10-CM | POA: Diagnosis not present

## 2023-04-09 DIAGNOSIS — E113513 Type 2 diabetes mellitus with proliferative diabetic retinopathy with macular edema, bilateral: Secondary | ICD-10-CM | POA: Diagnosis not present

## 2023-04-09 DIAGNOSIS — H4312 Vitreous hemorrhage, left eye: Secondary | ICD-10-CM | POA: Diagnosis not present

## 2023-04-17 ENCOUNTER — Ambulatory Visit: Payer: Medicaid Other | Admitting: "Endocrinology

## 2023-04-19 DIAGNOSIS — H4312 Vitreous hemorrhage, left eye: Secondary | ICD-10-CM | POA: Diagnosis not present

## 2023-04-19 DIAGNOSIS — H2513 Age-related nuclear cataract, bilateral: Secondary | ICD-10-CM | POA: Diagnosis not present

## 2023-04-19 DIAGNOSIS — H4053X2 Glaucoma secondary to other eye disorders, bilateral, moderate stage: Secondary | ICD-10-CM | POA: Diagnosis not present

## 2023-04-19 DIAGNOSIS — E113513 Type 2 diabetes mellitus with proliferative diabetic retinopathy with macular edema, bilateral: Secondary | ICD-10-CM | POA: Diagnosis not present

## 2023-04-19 DIAGNOSIS — H3581 Retinal edema: Secondary | ICD-10-CM | POA: Diagnosis not present

## 2023-04-22 ENCOUNTER — Ambulatory Visit: Payer: Medicaid Other | Admitting: "Endocrinology

## 2023-05-07 DIAGNOSIS — H3581 Retinal edema: Secondary | ICD-10-CM | POA: Diagnosis not present

## 2023-05-07 DIAGNOSIS — H4053X2 Glaucoma secondary to other eye disorders, bilateral, moderate stage: Secondary | ICD-10-CM | POA: Diagnosis not present

## 2023-05-07 DIAGNOSIS — H2513 Age-related nuclear cataract, bilateral: Secondary | ICD-10-CM | POA: Diagnosis not present

## 2023-05-07 DIAGNOSIS — H4312 Vitreous hemorrhage, left eye: Secondary | ICD-10-CM | POA: Diagnosis not present

## 2023-05-07 DIAGNOSIS — E113513 Type 2 diabetes mellitus with proliferative diabetic retinopathy with macular edema, bilateral: Secondary | ICD-10-CM | POA: Diagnosis not present

## 2023-05-08 DIAGNOSIS — H1820 Unspecified corneal edema: Secondary | ICD-10-CM | POA: Diagnosis not present

## 2023-05-08 DIAGNOSIS — H4053X2 Glaucoma secondary to other eye disorders, bilateral, moderate stage: Secondary | ICD-10-CM | POA: Diagnosis not present

## 2023-05-08 DIAGNOSIS — E113513 Type 2 diabetes mellitus with proliferative diabetic retinopathy with macular edema, bilateral: Secondary | ICD-10-CM | POA: Diagnosis not present

## 2023-05-08 DIAGNOSIS — H2513 Age-related nuclear cataract, bilateral: Secondary | ICD-10-CM | POA: Diagnosis not present

## 2023-05-27 DIAGNOSIS — H4053X2 Glaucoma secondary to other eye disorders, bilateral, moderate stage: Secondary | ICD-10-CM | POA: Diagnosis not present

## 2023-05-27 DIAGNOSIS — H1830 Unspecified corneal membrane change: Secondary | ICD-10-CM | POA: Diagnosis not present

## 2023-05-27 DIAGNOSIS — H1820 Unspecified corneal edema: Secondary | ICD-10-CM | POA: Diagnosis not present

## 2023-05-27 DIAGNOSIS — H2513 Age-related nuclear cataract, bilateral: Secondary | ICD-10-CM | POA: Diagnosis not present

## 2023-05-27 DIAGNOSIS — E113513 Type 2 diabetes mellitus with proliferative diabetic retinopathy with macular edema, bilateral: Secondary | ICD-10-CM | POA: Diagnosis not present

## 2023-05-31 ENCOUNTER — Ambulatory Visit (INDEPENDENT_AMBULATORY_CARE_PROVIDER_SITE_OTHER): Payer: Medicaid Other | Admitting: "Endocrinology

## 2023-05-31 ENCOUNTER — Encounter: Payer: Self-pay | Admitting: "Endocrinology

## 2023-05-31 VITALS — BP 138/80 | HR 80 | Ht 71.0 in | Wt 206.0 lb

## 2023-05-31 DIAGNOSIS — E782 Mixed hyperlipidemia: Secondary | ICD-10-CM | POA: Diagnosis not present

## 2023-05-31 DIAGNOSIS — Z7984 Long term (current) use of oral hypoglycemic drugs: Secondary | ICD-10-CM

## 2023-05-31 DIAGNOSIS — E1165 Type 2 diabetes mellitus with hyperglycemia: Secondary | ICD-10-CM

## 2023-05-31 LAB — POCT GLYCOSYLATED HEMOGLOBIN (HGB A1C): Hemoglobin A1C: 8.7 % — AB (ref 4.0–5.6)

## 2023-05-31 MED ORDER — ROSUVASTATIN CALCIUM 5 MG PO TABS: 5.0000 mg | ORAL_TABLET | Freq: Every day | ORAL | 3 refills | Status: AC

## 2023-05-31 MED ORDER — SYNJARDY XR 12.5-1000 MG PO TB24: 2.0000 | ORAL_TABLET | Freq: Every day | ORAL | 5 refills | Status: DC

## 2023-05-31 NOTE — Progress Notes (Signed)
Outpatient Endocrinology Note Cameron [place], Cameron Mccarthy  05/31/23   Cameron Mccarthy June 22, 1970 [id number]  Referring Provider: Margaree Mackintosh, Cameron Mccarthy Primary Care Provider: Margaree Mackintosh, Cameron Mccarthy Reason for consultation: Subjective   Assessment & Plan  Diagnoses and all orders for this visit:  Uncontrolled type 2 diabetes mellitus with hyperglycemia (HCC) -     POCT glycosylated hemoglobin (Hb A1C)  Other orders -     Empagliflozin-metFORMIN HCl ER (SYNJARDY XR) 12.08-998 MG TB24; Take 2 tablets by mouth daily. -     rosuvastatin (CRESTOR) 5 MG tablet; Take 1 tablet (5 mg total) by mouth daily.    Diabetes Type II complicated by neuropathy, retinopathy, microalbuminuria  Lab Results  Component Value Date   GFR 91.89 02/14/2023   Hba1c goal less than 7, current Hba1c is  Lab Results  Component Value Date   HGBA1C 8.7 (A) 05/31/2023   Will recommend the following: Currently on Jardiance 25 mg every day and metformin XR 500 mg bid Recommend to switch to synjardy XR 12.08/998 bid and stop above Not interested in CGM  Previously, Failed metformin due to GI intolerance Discussed need for insulin, patient is not interested in injections Requested for medications that he can afford   No known contraindications/side effects to any of above medications Once DM controlled, will need to start low dose ACE - and statin  -Last LD and Tg are as follows: Lab Results  Component Value Date   LDLCALC 188 (H) 02/14/2023    Lab Results  Component Value Date   TRIG 376.0 (H) 02/14/2023   -not on statin, discussed need for it -05/31/23: start rosuvastatin 5 mg every day  -Follow low fat diet and exercise   -Blood pressure goal <140/90 - Microalbumin/creatinine goal is < 30 -Last MA/Cr is as follows: Lab Results  Component Value Date   MICROALBUR 18.3 (H) 02/14/2023   -not on ACE/ARB  -diet changes including salt restriction -limit eating outside -counseled BP targets per  standards of diabetes care -uncontrolled blood pressure can lead to retinopathy, nephropathy and cardiovascular and atherosclerotic heart disease  Reviewed and counseled on: -A1C target -Blood sugar targets -Complications of uncontrolled diabetes  -Checking blood sugar before meals and bedtime and bring log next visit -All medications with mechanism of action and side effects -Hypoglycemia management: rule of 15's, Glucagon Emergency Kit and medical alert ID -low-carb low-fat plate-method diet -At least 20 minutes of physical activity per day -Annual dilated retinal eye exam and foot exam -compliance and follow up needs -follow up as scheduled or earlier if problem gets worse  Call if blood sugar is less than 70 or consistently above 250    Take a 15 gm snack of carbohydrate at bedtime before you go to sleep if your blood sugar is less than 100.    If you are going to fast after midnight for a test or procedure, ask your physician for instructions on how to reduce/decrease your insulin dose.    Call if blood sugar is less than 70 or consistently above 250  -Treating a low sugar by rule of 15  (15 gms of sugar every 15 min until sugar is more than 70) If you feel your sugar is low, test your sugar to be sure If your sugar is low (less than 70), then take 15 grams of a fast acting Carbohydrate (3-4 glucose tablets or glucose gel or 4 ounces of juice or regular soda) Recheck your sugar 15 min after treating  low to make sure it is more than 70 If sugar is still less than 70, treat again with 15 grams of carbohydrate          Don't drive the hour of hypoglycemia  If unconscious/unable to eat or drink by mouth, use glucagon injection or nasal spray baqsimi and call 911. Can repeat again in 15 min if still unconscious.  Return in about 3 months (around 09/02/2023).   I have reviewed current medications, nurse's notes, allergies, vital signs, past medical and surgical history, family medical  history, and social history for this encounter. Counseled patient on symptoms, examination findings, lab findings, imaging results, treatment decisions and monitoring and prognosis. The patient understood the recommendations and agrees with the treatment plan. All questions regarding treatment plan were fully answered.  Cameron [place], Cameron Mccarthy  05/31/23   History of Present Illness Cameron Mccarthy is a 53 y.o. year old male who presents for evaluation of Type II diabetes mellitus.  Cameron Mccarthy was first diagnosed around.   Diabetes education -  Home diabetes regimen: Jardiance 25 mg every day Metformin XR 500 mg bid   COMPLICATIONS -  MI/Stroke +  retinopathy +  neuropathy -  nephropathy  SYMPTOMS REVIEWED - Polyuria - Weight loss + Blurred vision, had eye surgery   BLOOD SUGAR DATA Checks different times, every other day Did not bring meter  148-224 per recall   Physical Exam  BP 138/80   Pulse 80   Ht 5\' 11"  (1.803 m)   Wt 206 lb (93.4 kg)   SpO2 99%   BMI 28.73 kg/m    Constitutional: well developed, well nourished Head: normocephalic, atraumatic Eyes: sclera anicteric, no redness Neck: supple Lungs: normal respiratory effort Neurology: alert and oriented Skin: dry, no appreciable rashes Musculoskeletal: no appreciable defects Psychiatric: normal mood and affect Diabetic Foot Exam - Simple   No data filed      Current Medications Patient's Medications  New Prescriptions   EMPAGLIFLOZIN-METFORMIN HCL ER (SYNJARDY XR) 12.08-998 MG TB24    Take 2 tablets by mouth daily.   ROSUVASTATIN (CRESTOR) 5 MG TABLET    Take 1 tablet (5 mg total) by mouth daily.  Previous Medications   ACETAZOLAMIDE (DIAMOX) 250 MG TABLET    Take 2 tablets (500 mg total) by mouth 2 (two) times daily.   ACYCLOVIR (ZOVIRAX) 400 MG TABLET    Take 800 mg by mouth 3 (three) times daily.   AMOXICILLIN-CLAVULANATE (AUGMENTIN) 875-125 MG TABLET    Take 1 tablet by mouth every 12 (twelve)  hours.   BRIMONIDINE (ALPHAGAN) 0.2 % OPHTHALMIC SOLUTION    Place 1 drop into the left eye 4 (four) times daily as needed.   DORZOLAMIDE-TIMOLOL (COSOPT) 2-0.5 % OPHTHALMIC SOLUTION    Place 1 drop into the left eye 2 (two) times daily.   HYDROCODONE BIT-HOMATROPINE (HYCODAN) 5-1.5 MG/5ML SYRUP    Take 5 mLs by mouth every 8 (eight) hours as needed for cough.  Modified Medications   No medications on file  Discontinued Medications   EMPAGLIFLOZIN (JARDIANCE) 25 MG TABS TABLET    Take 1 tablet (25 mg total) by mouth daily before breakfast.   METFORMIN (GLUCOPHAGE-XR) 500 MG 24 HR TABLET    Take 1 tablet (500 mg total) by mouth 2 (two) times daily with a meal.    Allergies No Known Allergies  Past Medical History Past Medical History:  Diagnosis Date   Diabetes mellitus without complication (HCC)     Not  taking DM meds- "I could not work and take DM meds."    Past Surgical History Past Surgical History:  Procedure Laterality Date   ORIF ANKLE FRACTURE Right 09/30/2019   Procedure: OPEN REDUCTION INTERNAL FIXATION (ORIF) RIGHT TRIMALLEOLAR ANKLE FRACTURE AND SYNDESMOSIS REPAIR;  Surgeon: Nadara Mustard, Cameron Mccarthy;  Location: [facility] OR;  Service: Orthopedics;  Laterality: Right;    Family History family history includes Gout in his father; Kidney Stones in his father.  Social History Social History   Socioeconomic History   Marital status: Married    Spouse name: Not on file   Number of children: Not on file   Years of education: Not on file   Highest education level: Not on file  Occupational History   Not on file  Tobacco Use   Smoking status: Never   Smokeless tobacco: Never   Tobacco comments:    "as a teenager on the back of the bus"  Vaping Use   Vaping status: Never Used  Substance and Sexual Activity   Alcohol use: Yes   Drug use: No   Sexual activity: Yes  Other Topics Concern   Not on file  Social History Narrative   Not on file   Social Drivers of Health    Financial Resource Strain: Not on file  Food Insecurity: Not on file  Transportation Needs: Not on file  Physical Activity: Not on file  Stress: Not on file  Social Connections: Not on file  Intimate Partner Violence: Not on file    Lab Results  Component Value Date   HGBA1C 8.7 (A) 05/31/2023   HGBA1C 9.9 (H) 02/14/2023   HGBA1C 11.5 (H) 01/01/2023   Lab Results  Component Value Date   CHOL 305 (H) 02/14/2023   Lab Results  Component Value Date   HDL 41.60 02/14/2023   Lab Results  Component Value Date   LDLCALC 188 (H) 02/14/2023   Lab Results  Component Value Date   TRIG 376.0 (H) 02/14/2023   Lab Results  Component Value Date   CHOLHDL 7 02/14/2023   Lab Results  Component Value Date   CREATININE 0.95 02/14/2023   Lab Results  Component Value Date   GFR 91.89 02/14/2023   Lab Results  Component Value Date   MICROALBUR 18.3 (H) 02/14/2023      Component Value Date/Time   NA 138 02/14/2023 1413   K 4.4 02/14/2023 1413   CL 99 02/14/2023 1413   CO2 26 02/14/2023 1413   GLUCOSE 255 (H) 02/14/2023 1413   BUN 25 (H) 02/14/2023 1413   CREATININE 0.95 02/14/2023 1413   CREATININE 0.90 01/01/2023 1233   CALCIUM 9.8 02/14/2023 1413   PROT 7.3 02/14/2023 1413   ALBUMIN 4.3 02/14/2023 1413   AST 11 02/14/2023 1413   ALT 14 02/14/2023 1413   ALKPHOS 42 02/14/2023 1413   BILITOT 1.2 02/14/2023 1413   GFRNONAA >60 12/22/2022 1530   GFRAA >60 09/30/2019 1122      Latest Ref Rng & Units 02/14/2023    2:13 PM 01/01/2023   12:33 PM 12/22/2022    3:30 PM  BMP  Glucose 70 - 99 mg/dL 409  811  914   BUN 6 - 23 mg/dL 25  16  13    Creatinine 0.40 - 1.50 mg/dL 7.82  9.56  2.13   BUN/Creat Ratio 6 - 22 (calc)  SEE NOTE:    Sodium 135 - 145 mEq/L 138  136  131   Potassium 3.5 -  5.1 mEq/L 4.4  4.2  4.0   Chloride 96 - 112 mEq/L 99  105  98   CO2 19 - 32 mEq/L 26  20  21    Calcium 8.4 - 10.5 mg/dL 9.8  8.9  8.8        Component Value Date/Time   WBC 7.3  12/22/2022 1530   RBC 5.07 12/22/2022 1530   HGB 15.3 12/22/2022 1530   HCT 43.4 12/22/2022 1530   PLT 297 12/22/2022 1530   MCV 85.6 12/22/2022 1530   MCH 30.2 12/22/2022 1530   MCHC 35.3 12/22/2022 1530   RDW 12.4 12/22/2022 1530   LYMPHSABS 2.5 01/26/2015 0347   MONOABS 0.8 01/26/2015 0347   EOSABS 0.3 01/26/2015 0347   BASOSABS 0.1 01/26/2015 0347     Parts of this note may have been dictated using voice recognition software. There may be variances in spelling and vocabulary which are unintentional. Not all errors are proofread. Please notify the Thereasa Parkin if any discrepancies are noted or if the meaning of any statement is not clear.

## 2023-05-31 NOTE — Patient Instructions (Signed)

## 2023-06-03 ENCOUNTER — Telehealth: Payer: Self-pay

## 2023-06-03 NOTE — Telephone Encounter (Signed)
PA needed for [facility]

## 2023-06-04 DIAGNOSIS — E113513 Type 2 diabetes mellitus with proliferative diabetic retinopathy with macular edema, bilateral: Secondary | ICD-10-CM | POA: Diagnosis not present

## 2023-06-04 DIAGNOSIS — H3581 Retinal edema: Secondary | ICD-10-CM | POA: Diagnosis not present

## 2023-06-04 DIAGNOSIS — H4312 Vitreous hemorrhage, left eye: Secondary | ICD-10-CM | POA: Diagnosis not present

## 2023-06-04 DIAGNOSIS — H2513 Age-related nuclear cataract, bilateral: Secondary | ICD-10-CM | POA: Diagnosis not present

## 2023-06-04 DIAGNOSIS — H4053X2 Glaucoma secondary to other eye disorders, bilateral, moderate stage: Secondary | ICD-10-CM | POA: Diagnosis not present

## 2023-06-05 ENCOUNTER — Other Ambulatory Visit: Payer: Self-pay | Admitting: Internal Medicine

## 2023-06-05 ENCOUNTER — Telehealth: Payer: Self-pay

## 2023-06-05 ENCOUNTER — Other Ambulatory Visit (HOSPITAL_COMMUNITY): Payer: Self-pay

## 2023-06-05 NOTE — Telephone Encounter (Signed)
 Pharmacy Patient Advocate Encounter   Received notification from Pt Calls Messages that prior authorization for Synjardy  is required/requested.   Insurance verification completed.   The patient is insured through [facility] .   Per test claim: PA required; PA submitted to above mentioned insurance via CoverMyMeds Key/confirmation #/EOC [id number] Status is pending

## 2023-06-06 ENCOUNTER — Other Ambulatory Visit (HOSPITAL_COMMUNITY): Payer: Self-pay

## 2023-06-10 ENCOUNTER — Other Ambulatory Visit: Payer: Self-pay | Admitting: Internal Medicine

## 2023-06-11 ENCOUNTER — Other Ambulatory Visit: Payer: Self-pay | Admitting: *Deleted

## 2023-06-11 DIAGNOSIS — E1165 Type 2 diabetes mellitus with hyperglycemia: Secondary | ICD-10-CM

## 2023-06-11 MED ORDER — SYNJARDY XR 12.5-1000 MG PO TB24: 2.0000 | ORAL_TABLET | Freq: Every day | ORAL | 5 refills | Status: DC

## 2023-06-11 NOTE — Telephone Encounter (Signed)
Pharmacy Patient Advocate Encounter  Received notification from [facility] that Prior Authorization for [place] has been APPROVED through 06/03/2024   PA #/Case ID/Reference #: [id number]

## 2023-06-19 DIAGNOSIS — E113513 Type 2 diabetes mellitus with proliferative diabetic retinopathy with macular edema, bilateral: Secondary | ICD-10-CM | POA: Diagnosis not present

## 2023-06-19 DIAGNOSIS — H2513 Age-related nuclear cataract, bilateral: Secondary | ICD-10-CM | POA: Diagnosis not present

## 2023-06-19 DIAGNOSIS — H4053X2 Glaucoma secondary to other eye disorders, bilateral, moderate stage: Secondary | ICD-10-CM | POA: Diagnosis not present

## 2023-06-19 DIAGNOSIS — H1830 Unspecified corneal membrane change: Secondary | ICD-10-CM | POA: Diagnosis not present

## 2023-06-19 DIAGNOSIS — H1820 Unspecified corneal edema: Secondary | ICD-10-CM | POA: Diagnosis not present

## 2023-07-10 DIAGNOSIS — H1830 Unspecified corneal membrane change: Secondary | ICD-10-CM | POA: Diagnosis not present

## 2023-07-10 DIAGNOSIS — H2513 Age-related nuclear cataract, bilateral: Secondary | ICD-10-CM | POA: Diagnosis not present

## 2023-07-10 DIAGNOSIS — H4053X2 Glaucoma secondary to other eye disorders, bilateral, moderate stage: Secondary | ICD-10-CM | POA: Diagnosis not present

## 2023-07-10 DIAGNOSIS — H1820 Unspecified corneal edema: Secondary | ICD-10-CM | POA: Diagnosis not present

## 2023-07-10 DIAGNOSIS — E113513 Type 2 diabetes mellitus with proliferative diabetic retinopathy with macular edema, bilateral: Secondary | ICD-10-CM | POA: Diagnosis not present

## 2023-07-11 DIAGNOSIS — E113513 Type 2 diabetes mellitus with proliferative diabetic retinopathy with macular edema, bilateral: Secondary | ICD-10-CM | POA: Diagnosis not present

## 2023-07-11 DIAGNOSIS — H4051X2 Glaucoma secondary to other eye disorders, right eye, moderate stage: Secondary | ICD-10-CM | POA: Diagnosis not present

## 2023-07-11 DIAGNOSIS — H2513 Age-related nuclear cataract, bilateral: Secondary | ICD-10-CM | POA: Diagnosis not present

## 2023-07-11 DIAGNOSIS — H4053X2 Glaucoma secondary to other eye disorders, bilateral, moderate stage: Secondary | ICD-10-CM | POA: Diagnosis not present

## 2023-07-11 DIAGNOSIS — H4052X3 Glaucoma secondary to other eye disorders, left eye, severe stage: Secondary | ICD-10-CM | POA: Diagnosis not present

## 2023-07-23 DIAGNOSIS — H2513 Age-related nuclear cataract, bilateral: Secondary | ICD-10-CM | POA: Diagnosis not present

## 2023-07-23 DIAGNOSIS — H4053X2 Glaucoma secondary to other eye disorders, bilateral, moderate stage: Secondary | ICD-10-CM | POA: Diagnosis not present

## 2023-07-23 DIAGNOSIS — E113513 Type 2 diabetes mellitus with proliferative diabetic retinopathy with macular edema, bilateral: Secondary | ICD-10-CM | POA: Diagnosis not present

## 2023-07-23 DIAGNOSIS — H4312 Vitreous hemorrhage, left eye: Secondary | ICD-10-CM | POA: Diagnosis not present

## 2023-07-23 DIAGNOSIS — E1136 Type 2 diabetes mellitus with diabetic cataract: Secondary | ICD-10-CM | POA: Diagnosis not present

## 2023-07-23 DIAGNOSIS — H3581 Retinal edema: Secondary | ICD-10-CM | POA: Diagnosis not present

## 2023-07-23 DIAGNOSIS — Z7984 Long term (current) use of oral hypoglycemic drugs: Secondary | ICD-10-CM | POA: Diagnosis not present

## 2023-08-19 ENCOUNTER — Other Ambulatory Visit: Payer: Self-pay

## 2023-08-19 DIAGNOSIS — E1165 Type 2 diabetes mellitus with hyperglycemia: Secondary | ICD-10-CM

## 2023-08-19 MED ORDER — SYNJARDY XR 12.5-1000 MG PO TB24: 2.0000 | ORAL_TABLET | Freq: Every day | ORAL | 5 refills | Status: DC

## 2023-08-19 NOTE — Telephone Encounter (Signed)
 Requested Prescriptions   Pending Prescriptions Disp Refills   Empagliflozin -metFORMIN  HCl ER (SYNJARDY  XR) 12.08-998 MG TB24 60 tablet 5    Sig: Take 2 tablets by mouth daily.

## 2023-08-22 ENCOUNTER — Other Ambulatory Visit (HOSPITAL_COMMUNITY): Payer: Self-pay

## 2023-08-27 ENCOUNTER — Other Ambulatory Visit (HOSPITAL_COMMUNITY): Payer: Self-pay

## 2023-08-27 ENCOUNTER — Telehealth: Payer: Self-pay

## 2023-08-27 NOTE — Telephone Encounter (Signed)
 Pharmacy Patient Advocate Encounter   Received notification from CoverMyMeds that prior authorization for Synjardy  is required/requested.   Insurance verification completed.   The patient is insured through [facility] .   Per test claim: PA required; PA submitted to above mentioned insurance via CoverMyMeds Key/confirmation #/EOC [id number] Status is pending

## 2023-08-28 ENCOUNTER — Other Ambulatory Visit: Payer: Self-pay

## 2023-08-28 DIAGNOSIS — E1165 Type 2 diabetes mellitus with hyperglycemia: Secondary | ICD-10-CM

## 2023-08-28 MED ORDER — SYNJARDY XR 12.5-1000 MG PO TB24: 2.0000 | ORAL_TABLET | Freq: Every day | ORAL | 1 refills | Status: DC

## 2023-08-29 ENCOUNTER — Ambulatory Visit: Payer: Medicaid Other | Admitting: "Endocrinology

## 2023-08-30 ENCOUNTER — Telehealth: Payer: Self-pay

## 2023-08-30 NOTE — Telephone Encounter (Signed)
 SYNJARDY  sample picked up

## 2023-08-30 NOTE — Telephone Encounter (Signed)
 Pt needs PA done fro synjardy . Thank you.

## 2023-09-06 NOTE — Telephone Encounter (Signed)
 Pharmacy Patient Advocate Encounter  Received notification from [facility] that Prior Authorization for Synjardy  has been CANCELLED due to this member has alternative pharmacy benefits. [employer]  is the payer of last resort. Please have the pharmacy bill the member's other insurance plan first.  Primary insurance is [employer] and a PA was already approved through them in February through 06/13/2024

## 2023-09-18 ENCOUNTER — Other Ambulatory Visit (HOSPITAL_COMMUNITY): Payer: Self-pay

## 2023-09-19 ENCOUNTER — Telehealth: Payer: Self-pay

## 2023-09-19 NOTE — Telephone Encounter (Signed)
 Error

## 2023-09-24 ENCOUNTER — Telehealth: Payer: Self-pay

## 2023-09-24 NOTE — Telephone Encounter (Signed)
 Sample  Device/Supplies:  Synjardy  12.5mg /1000mg   Quantity:2 WNU:[id number] EXP:11/27/2024

## 2023-10-23 ENCOUNTER — Ambulatory Visit (INDEPENDENT_AMBULATORY_CARE_PROVIDER_SITE_OTHER): Admitting: "Endocrinology

## 2023-10-23 ENCOUNTER — Other Ambulatory Visit: Payer: Self-pay

## 2023-10-23 ENCOUNTER — Encounter: Payer: Self-pay | Admitting: "Endocrinology

## 2023-10-23 VITALS — BP 140/86 | HR 67 | Ht 71.0 in | Wt 205.0 lb

## 2023-10-23 DIAGNOSIS — E1165 Type 2 diabetes mellitus with hyperglycemia: Secondary | ICD-10-CM

## 2023-10-23 DIAGNOSIS — E782 Mixed hyperlipidemia: Secondary | ICD-10-CM | POA: Diagnosis not present

## 2023-10-23 DIAGNOSIS — Z7984 Long term (current) use of oral hypoglycemic drugs: Secondary | ICD-10-CM

## 2023-10-23 LAB — POCT GLYCOSYLATED HEMOGLOBIN (HGB A1C): Hemoglobin A1C: 8.8 % — AB (ref 4.0–5.6)

## 2023-10-23 MED ORDER — SYNJARDY XR 12.5-1000 MG PO TB24: 2.0000 | ORAL_TABLET | Freq: Every day | ORAL | 1 refills | Status: AC

## 2023-10-23 MED ORDER — SYNJARDY XR 12.5-1000 MG PO TB24: 2.0000 | ORAL_TABLET | Freq: Every day | ORAL | 1 refills | Status: DC

## 2023-10-23 NOTE — Patient Instructions (Signed)

## 2023-10-23 NOTE — Progress Notes (Signed)
 Outpatient Endocrinology Note Cameron [place], MD  10/23/23   [name] 1970-05-27 [id number]  Referring Provider: [name], MD Primary Care Provider: [name], MD Reason for consultation: Subjective   Assessment & Plan  Diagnoses and all orders for this visit:  Uncontrolled type 2 diabetes mellitus with hyperglycemia (HCC) -     POCT glycosylated hemoglobin (Hb A1C) -     Empagliflozin -metFORMIN  HCl ER (SYNJARDY  XR) 12.08-998 MG TB24; Take 2 tablets by mouth daily.  Long term (current) use of oral hypoglycemic drugs  Mixed hypercholesterolemia and hypertriglyceridemia   Diabetes Type II complicated by neuropathy, retinopathy, microalbuminuria  Lab Results  Component Value Date   GFR 91.89 02/14/2023   Hba1c goal less than 7, current Hba1c is  Lab Results  Component Value Date   HGBA1C 8.8 (A) 10/23/2023   Will recommend the following: Synjardy  XR 12.08/998 bid  Not interested in CGM  Previously, Failed metformin  due to GI intolerance Discussed need for insulin , patient is not interested in injections Requested for medications that he can afford   No known contraindications/side effects to any of above medications Once DM controlled, will need to start low dose ACE - and statin  -Last LD and Tg are as follows: Lab Results  Component Value Date   LDLCALC 188 (H) 02/14/2023    Lab Results  Component Value Date   TRIG 376.0 (H) 02/14/2023   -not on statin, discussed need for it -05/31/23: start rosuvastatin  5 mg every day, did not start it -10/23/23: counseled extensively on need on statin, patient says he has it and will start it  -Follow low fat diet and exercise   -Blood pressure goal <140/90 - Microalbumin/creatinine goal is < 30 -Last MA/Cr is as follows: Lab Results  Component Value Date   MICROALBUR 18.3 (H) 02/14/2023   -not on ACE/ARB  -diet changes including salt restriction -limit eating outside -counseled BP targets  per standards of diabetes care -uncontrolled blood pressure can lead to retinopathy, nephropathy and cardiovascular and atherosclerotic heart disease  Reviewed and counseled on: -A1C target -Blood sugar targets -Complications of uncontrolled diabetes  -Checking blood sugar before meals and bedtime and bring log next visit -All medications with mechanism of action and side effects -Hypoglycemia management: rule of 15's, Glucagon Emergency Kit and medical alert ID -low-carb low-fat plate-method diet -At least 20 minutes of physical activity per day -Annual dilated retinal eye exam and foot exam -compliance and follow up needs -follow up as scheduled or earlier if problem gets worse  Call if blood sugar is less than 70 or consistently above 250    Take a 15 gm snack of carbohydrate at bedtime before you go to sleep if your blood sugar is less than 100.    If you are going to fast after midnight for a test or procedure, ask your physician for instructions on how to reduce/decrease your insulin  dose.    Call if blood sugar is less than 70 or consistently above 250  -Treating a low sugar by rule of 15  (15 gms of sugar every 15 min until sugar is more than 70) If you feel your sugar is low, test your sugar to be sure If your sugar is low (less than 70), then take 15 grams of a fast acting Carbohydrate (3-4 glucose tablets or glucose gel or 4 ounces of juice or regular soda) Recheck your sugar 15 min after treating low to make sure it is more than  70 If sugar is still less than 70, treat again with 15 grams of carbohydrate          Don't drive the hour of hypoglycemia  If unconscious/unable to eat or drink by mouth, use glucagon injection or nasal spray baqsimi and call 911. Can repeat again in 15 min if still unconscious.  Return in about 3 months (around 01/23/2024).   I have reviewed current medications, nurse's notes, allergies, vital signs, past medical and surgical history, family  medical history, and social history for this encounter. Counseled patient on symptoms, examination findings, lab findings, imaging results, treatment decisions and monitoring and prognosis. The patient understood the recommendations and agrees with the treatment plan. All questions regarding treatment plan were fully answered.  Cameron [place], MD  10/23/23   History of Present Illness Cameron Mccarthy is a 53 y.o. year old male who presents for follow up of Type II diabetes mellitus.  Cameron Mccarthy was first diagnosed around.   Diabetes education -  Home diabetes regimen: Synjardy  XR 12.5/1000mg  BID -ran out about a month ago   COMPLICATIONS -  MI/Stroke +  retinopathy +  neuropathy -  nephropathy  BLOOD SUGAR DATA Not checking BG  Physical Exam  BP (!) 140/86   Pulse 67   Ht 5' 11 (1.803 m)   Wt 205 lb (93 kg)   SpO2 97%   BMI 28.59 kg/m    Constitutional: well developed, well nourished Head: normocephalic, atraumatic Eyes: sclera anicteric, no redness Neck: supple Lungs: normal respiratory effort Neurology: alert and oriented Skin: dry, no appreciable rashes Musculoskeletal: no appreciable defects Psychiatric: normal mood and affect Diabetic Foot Exam - Simple   No data filed      Current Medications Patient's Medications  New Prescriptions   No medications on file  Previous Medications   ACETAZOLAMIDE  (DIAMOX ) 250 MG TABLET    Take 2 tablets (500 mg total) by mouth 2 (two) times daily.   ACYCLOVIR (ZOVIRAX) 400 MG TABLET    Take 800 mg by mouth 3 (three) times daily.   AMOXICILLIN -CLAVULANATE (AUGMENTIN ) 875-125 MG TABLET    Take 1 tablet by mouth every 12 (twelve) hours.   BRIMONIDINE (ALPHAGAN) 0.2 % OPHTHALMIC SOLUTION    Place 1 drop into the left eye 4 (four) times daily as needed.   DORZOLAMIDE -TIMOLOL  (COSOPT ) 2-0.5 % OPHTHALMIC SOLUTION    Place 1 drop into the left eye 2 (two) times daily.   HYDROCODONE  BIT-HOMATROPINE (HYCODAN) 5-1.5 MG/5ML  SYRUP    Take 5 mLs by mouth every 8 (eight) hours as needed for cough.   ROSUVASTATIN  (CRESTOR ) 5 MG TABLET    Take 1 tablet (5 mg total) by mouth daily.  Modified Medications   Modified Medication Previous Medication   EMPAGLIFLOZIN -METFORMIN  HCL ER (SYNJARDY  XR) 12.08-998 MG TB24 Empagliflozin -metFORMIN  HCl ER (SYNJARDY  XR) 12.08-998 MG TB24      Take 2 tablets by mouth daily.    Take 2 tablets by mouth daily.  Discontinued Medications   No medications on file    Allergies No Known Allergies  Past Medical History Past Medical History:  Diagnosis Date   Diabetes mellitus without complication (HCC)     Not taking DM meds- I could not work and take DM meds.    Past Surgical History Past Surgical History:  Procedure Laterality Date   ORIF ANKLE FRACTURE Right 09/30/2019   Procedure: OPEN REDUCTION INTERNAL FIXATION (ORIF) RIGHT TRIMALLEOLAR ANKLE FRACTURE AND SYNDESMOSIS REPAIR;  Surgeon: Harden Jerona GAILS,  MD;  Location: [facility] OR;  Service: Orthopedics;  Laterality: Right;    Family History family history includes Gout in his father; Kidney Stones in his father.  Social History Social History   Socioeconomic History   Marital status: Married    Spouse name: Not on file   Number of children: Not on file   Years of education: Not on file   Highest education level: Not on file  Occupational History   Not on file  Tobacco Use   Smoking status: Never   Smokeless tobacco: Never   Tobacco comments:    as a teenager on the back of the bus  Vaping Use   Vaping status: Never Used  Substance and Sexual Activity   Alcohol use: Yes   Drug use: No   Sexual activity: Yes  Other Topics Concern   Not on file  Social History Narrative   Not on file   Social Drivers of Health   Financial Resource Strain: Not on file  Food Insecurity: Not on file  Transportation Needs: Not on file  Physical Activity: Not on file  Stress: Not on file  Social Connections: Not on file  Intimate  Partner Violence: Not on file    Lab Results  Component Value Date   HGBA1C 8.8 (A) 10/23/2023   HGBA1C 8.7 (A) 05/31/2023   HGBA1C 9.9 (H) 02/14/2023   Lab Results  Component Value Date   CHOL 305 (H) 02/14/2023   Lab Results  Component Value Date   HDL 41.60 02/14/2023   Lab Results  Component Value Date   LDLCALC 188 (H) 02/14/2023   Lab Results  Component Value Date   TRIG 376.0 (H) 02/14/2023   Lab Results  Component Value Date   CHOLHDL 7 02/14/2023   Lab Results  Component Value Date   CREATININE 0.95 02/14/2023   Lab Results  Component Value Date   GFR 91.89 02/14/2023   Lab Results  Component Value Date   MICROALBUR 18.3 (H) 02/14/2023      Component Value Date/Time   NA 138 02/14/2023 1413   K 4.4 02/14/2023 1413   CL 99 02/14/2023 1413   CO2 26 02/14/2023 1413   GLUCOSE 255 (H) 02/14/2023 1413   BUN 25 (H) 02/14/2023 1413   CREATININE 0.95 02/14/2023 1413   CREATININE 0.90 01/01/2023 1233   CALCIUM  9.8 02/14/2023 1413   PROT 7.3 02/14/2023 1413   ALBUMIN 4.3 02/14/2023 1413   AST 11 02/14/2023 1413   ALT 14 02/14/2023 1413   ALKPHOS 42 02/14/2023 1413   BILITOT 1.2 02/14/2023 1413   GFRNONAA >60 12/22/2022 1530   GFRAA >60 09/30/2019 1122      Latest Ref Rng & Units 02/14/2023    2:13 PM 01/01/2023   12:33 PM 12/22/2022    3:30 PM  BMP  Glucose 70 - 99 mg/dL 744  709  597   BUN 6 - 23 mg/dL 25  16  13    Creatinine 0.40 - 1.50 mg/dL 9.04  9.09  9.00   BUN/Creat Ratio 6 - 22 (calc)  SEE NOTE:    Sodium 135 - 145 mEq/L 138  136  131   Potassium 3.5 - 5.1 mEq/L 4.4  4.2  4.0   Chloride 96 - 112 mEq/L 99  105  98   CO2 19 - 32 mEq/L 26  20  21    Calcium  8.4 - 10.5 mg/dL 9.8  8.9  8.8        Component  Value Date/Time   WBC 7.3 12/22/2022 1530   RBC 5.07 12/22/2022 1530   HGB 15.3 12/22/2022 1530   HCT 43.4 12/22/2022 1530   PLT 297 12/22/2022 1530   MCV 85.6 12/22/2022 1530   MCH 30.2 12/22/2022 1530   MCHC 35.3 12/22/2022 1530    RDW 12.4 12/22/2022 1530   LYMPHSABS 2.5 01/26/2015 0347   MONOABS 0.8 01/26/2015 0347   EOSABS 0.3 01/26/2015 0347   BASOSABS 0.1 01/26/2015 0347     Parts of this note may have been dictated using voice recognition software. There may be variances in spelling and vocabulary which are unintentional. Not all errors are proofread. Please notify the dino if any discrepancies are noted or if the meaning of any statement is not clear.

## 2023-11-08 ENCOUNTER — Telehealth: Payer: Self-pay

## 2023-11-08 NOTE — Telephone Encounter (Signed)
 Pt wife picked pt assistance paper work for synjardy .

## 2023-11-11 ENCOUNTER — Telehealth: Payer: Self-pay

## 2023-11-11 ENCOUNTER — Other Ambulatory Visit (HOSPITAL_COMMUNITY): Payer: Self-pay

## 2023-11-11 NOTE — Telephone Encounter (Signed)
 Pharmacy Patient Advocate Encounter   Received notification from Physician's Office that prior authorization for Synjardy  XR 12.5-1000MG  tb24 is required/requested.   Insurance verification completed.   The patient is insured through [employer] .   Per test claim: PLEASE SUBMIT TO OTHER PROCESSOR OR PRIMARY PAYER  I see he previously had Primary coverage through [employer] and a PA was submitted and approved. There was a request to also do a PA through his secondary insurance [employer]). That PA was cancelled stating they were not the primary payer and no PA was needed.   Now I see that [place] coverage has been terminated, however [employer] is still saying they are not the primary coverage. He will have to reach out to his insurance and let them know he no longer has alternate coverage and needs to list [employer] as his primary coverage. A PA can not be submitted until then as they will just cancel it again.

## 2023-11-11 NOTE — Telephone Encounter (Signed)
 Pt informed that he will need to call his insurance company and let them know he no longer has alternate coverage.

## 2023-11-11 NOTE — Telephone Encounter (Signed)
 Spoke to pt regarding PA. Pt called insurance company and was told to reopen that PA and remover [employer] and only use that [employer] for the PA.

## 2023-11-20 ENCOUNTER — Other Ambulatory Visit (HOSPITAL_COMMUNITY): Payer: Self-pay

## 2023-11-20 NOTE — Telephone Encounter (Signed)
 Pharmacy Patient Advocate Encounter  Received notification from [facility] that Prior Authorization for Synjardy  XR 12.5-1000MG  er tablets has been APPROVED from 11/11/2023 to 11/10/2024. Unable to obtain price due to refill too soon rejection, last fill date 11/11/2023 next available fill date08/31/2025   PA #/Case ID/Reference #: [id number]

## 2024-01-08 ENCOUNTER — Other Ambulatory Visit: Payer: Self-pay | Admitting: "Endocrinology

## 2024-01-08 DIAGNOSIS — E1165 Type 2 diabetes mellitus with hyperglycemia: Secondary | ICD-10-CM

## 2024-02-18 ENCOUNTER — Other Ambulatory Visit

## 2024-02-19 LAB — COMPREHENSIVE METABOLIC PANEL WITH GFR
AG Ratio: 1.2 (calc) (ref 1.0–2.5)
ALT: 9 U/L (ref 9–46)
AST: 10 U/L (ref 10–35)
Albumin: 4.2 g/dL (ref 3.6–5.1)
Alkaline phosphatase (APISO): 53 U/L (ref 35–144)
BUN: 21 mg/dL (ref 7–25)
CO2: 23 mmol/L (ref 20–32)
Calcium: 9.4 mg/dL (ref 8.6–10.3)
Chloride: 102 mmol/L (ref 98–110)
Creat: 1.08 mg/dL (ref 0.70–1.30)
Globulin: 3.5 g/dL (ref 1.9–3.7)
Glucose, Bld: 131 mg/dL — ABNORMAL HIGH (ref 65–99)
Potassium: 5.2 mmol/L (ref 3.5–5.3)
Sodium: 136 mmol/L (ref 135–146)
Total Bilirubin: 0.6 mg/dL (ref 0.2–1.2)
Total Protein: 7.7 g/dL (ref 6.1–8.1)
eGFR: 82 mL/min/1.73m2 (ref >=60)

## 2024-02-19 LAB — HEMOGLOBIN A1C
Hgb A1c MFr Bld: 8.5 % — ABNORMAL HIGH (ref <5.7)
Mean Plasma Glucose: 197 mg/dL
eAG (mmol/L): 10.9 mmol/L

## 2024-02-19 LAB — MICROALBUMIN / CREATININE URINE RATIO
Creatinine, Urine: 56 mg/dL (ref 20–320)
Microalb Creat Ratio: 318 mg/g{creat} — ABNORMAL HIGH (ref <30)
Microalb, Ur: 17.8 mg/dL

## 2024-02-19 LAB — LIPID PANEL
Cholesterol: 232 mg/dL — ABNORMAL HIGH (ref <200)
HDL: 41 mg/dL (ref >=40)
LDL Cholesterol (Calc): 150 mg/dL — ABNORMAL HIGH
Non-HDL Cholesterol (Calc): 191 mg/dL — ABNORMAL HIGH (ref <130)
Total CHOL/HDL Ratio: 5.7 (calc) — ABNORMAL HIGH (ref <5.0)
Triglycerides: 241 mg/dL — ABNORMAL HIGH (ref <150)

## 2024-03-20 ENCOUNTER — Telehealth: Payer: Self-pay

## 2024-03-20 ENCOUNTER — Other Ambulatory Visit (HOSPITAL_COMMUNITY): Payer: Self-pay

## 2024-03-20 NOTE — Telephone Encounter (Signed)
 Pharmacy Patient Advocate Encounter   Received notification from Pt Calls Messages that prior authorization for Synjardy  is required/requested.   Insurance verification completed.   The patient was previously insured through [facility], however it appears that coverage has been terminated. I am unable to verify active coverage. Please have pt upload new insurance information.

## 2024-03-20 NOTE — Telephone Encounter (Signed)
 Pt needs PA for (SYNJARDY  XR) 12.08-998 MG.

## 2024-03-23 ENCOUNTER — Other Ambulatory Visit: Payer: Self-pay

## 2024-03-23 MED ORDER — SYNJARDY XR 25-1000 MG PO TB24: 1.0000 | ORAL_TABLET | Freq: Every day | ORAL | Status: AC

## 2024-04-10 ENCOUNTER — Other Ambulatory Visit: Payer: Self-pay

## 2024-04-10 MED ORDER — SYNJARDY XR 25-1000 MG PO TB24: 1.0000 | ORAL_TABLET | Freq: Every day | ORAL | Status: DC

## 2024-04-10 MED ORDER — SYNJARDY XR 25-1000 MG PO TB24: 1.0000 | ORAL_TABLET | Freq: Every day | ORAL | 0 refills | Status: AC

## 2024-04-10 NOTE — Addendum Note (Signed)
 Addended by: Anikin Prosser on: 04/10/2024 09:39 AM   Modules accepted: Orders

## 2024-04-13 ENCOUNTER — Ambulatory Visit: Admitting: "Endocrinology

## 2024-04-21 ENCOUNTER — Telehealth: Payer: Self-pay

## 2024-04-21 NOTE — Telephone Encounter (Signed)
 Pt wife called office regarding sample for synjardy  12.5/1000mg . Pt wife picked up sample on 04/10/24  synjardy  25/1000 mg.  I accidentally gave pt the wrong does 25/1000mg  per last chart note he is suppose to be taking 12.5/1000mg . please advise.

## 2024-04-21 NOTE — Telephone Encounter (Signed)
 Called both pt and wife regarding medication, Lvm to call back.

## 2024-04-24 ENCOUNTER — Other Ambulatory Visit: Payer: Self-pay

## 2024-04-24 ENCOUNTER — Telehealth: Payer: Self-pay

## 2024-04-24 DIAGNOSIS — E1165 Type 2 diabetes mellitus with hyperglycemia: Secondary | ICD-10-CM

## 2024-04-24 NOTE — Telephone Encounter (Signed)
 Spoke to pt wife regarding the accident with giving pt the wrong sample dose of synjardy ., I apologized pt wife she  was not upset and was very understanding. Pt wife stated that he has been taking Synjardy  XR 25/1000 mg bid. Per last note pt is to take Synjardy  XR 12.08/998 bid. I asked pt wife if he had been experienced any:  increased urination,dizziness on standing, urinary tract infections or Blood sugars less than 70. Pt wife stated that he has not.  I informed pt wife that I have ordered BMP per Dr Dartha request for them to do to make sure that the electrolytes are within normal range and ask patient wife to have pt  to resume the correct dose (if that is the only sample we have, they can only take Synjardy  XR 25/1000 mg ONCE a day). Labs have been ordered.

## 2024-04-27 ENCOUNTER — Encounter: Payer: Self-pay | Admitting: "Endocrinology

## 2024-04-27 ENCOUNTER — Telehealth (INDEPENDENT_AMBULATORY_CARE_PROVIDER_SITE_OTHER): Admitting: "Endocrinology

## 2024-04-27 VITALS — Ht 71.0 in | Wt 205.0 lb

## 2024-04-27 DIAGNOSIS — E1165 Type 2 diabetes mellitus with hyperglycemia: Secondary | ICD-10-CM

## 2024-04-27 DIAGNOSIS — E782 Mixed hyperlipidemia: Secondary | ICD-10-CM | POA: Diagnosis not present

## 2024-04-27 DIAGNOSIS — Z7984 Long term (current) use of oral hypoglycemic drugs: Secondary | ICD-10-CM
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
